# Patient Record
Sex: Female | Born: 1950 | Race: White | Hispanic: No | Smoking: Never smoker
Health system: Southern US, Community
[De-identification: ages and names within clinical notes are randomized; demographics above are authoritative.]

## PROBLEM LIST (undated history)

## (undated) DIAGNOSIS — N200 Calculus of kidney: Secondary | ICD-10-CM

## (undated) DIAGNOSIS — F419 Anxiety disorder, unspecified: Secondary | ICD-10-CM

## (undated) DIAGNOSIS — F4310 Post-traumatic stress disorder, unspecified: Secondary | ICD-10-CM

## (undated) HISTORY — PX: LAMINECTOMY: SHX219

## (undated) HISTORY — PX: AUGMENTATION MAMMAPLASTY: SUR837

## (undated) HISTORY — PX: TONSILLECTOMY: SUR1361

## (undated) HISTORY — PX: REDUCTION MAMMAPLASTY: SUR839

---

## 2014-05-28 DIAGNOSIS — Z87828 Personal history of other (healed) physical injury and trauma: Secondary | ICD-10-CM

## 2014-05-28 HISTORY — DX: Personal history of other (healed) physical injury and trauma: Z87.828

## 2017-11-12 ENCOUNTER — Other Ambulatory Visit: Payer: Self-pay | Admitting: Family Medicine

## 2017-11-12 DIAGNOSIS — Z1231 Encounter for screening mammogram for malignant neoplasm of breast: Secondary | ICD-10-CM

## 2017-11-12 DIAGNOSIS — E2839 Other primary ovarian failure: Secondary | ICD-10-CM

## 2018-01-29 ENCOUNTER — Ambulatory Visit
Admission: RE | Admit: 2018-01-29 | Discharge: 2018-01-29 | Disposition: A | Discharge status: Home / Self Care | Payer: BLUE CROSS/BLUE SHIELD | Source: Ambulatory Visit | Attending: Family Medicine | Admitting: Family Medicine

## 2018-01-29 ENCOUNTER — Encounter: Payer: Self-pay | Admitting: Radiology

## 2018-01-29 DIAGNOSIS — Z1231 Encounter for screening mammogram for malignant neoplasm of breast: Secondary | ICD-10-CM

## 2018-01-29 DIAGNOSIS — E2839 Other primary ovarian failure: Secondary | ICD-10-CM

## 2020-07-17 IMAGING — MG DIGITAL SCREENING BILATERAL MAMMOGRAM WITH IMPLANTS, CAD AND TOM
9 of 12 series · 9 of 28 positions shown · non-contrast
Comparison: Previous exam(s).

CLINICAL DATA: Screening.

EXAM:
DIGITAL SCREENING BILATERAL MAMMOGRAM WITH IMPLANTS, CAD AND TOMO
The patient has retropectoral implants. Standard and implant
displaced views were performed.

[R CC]
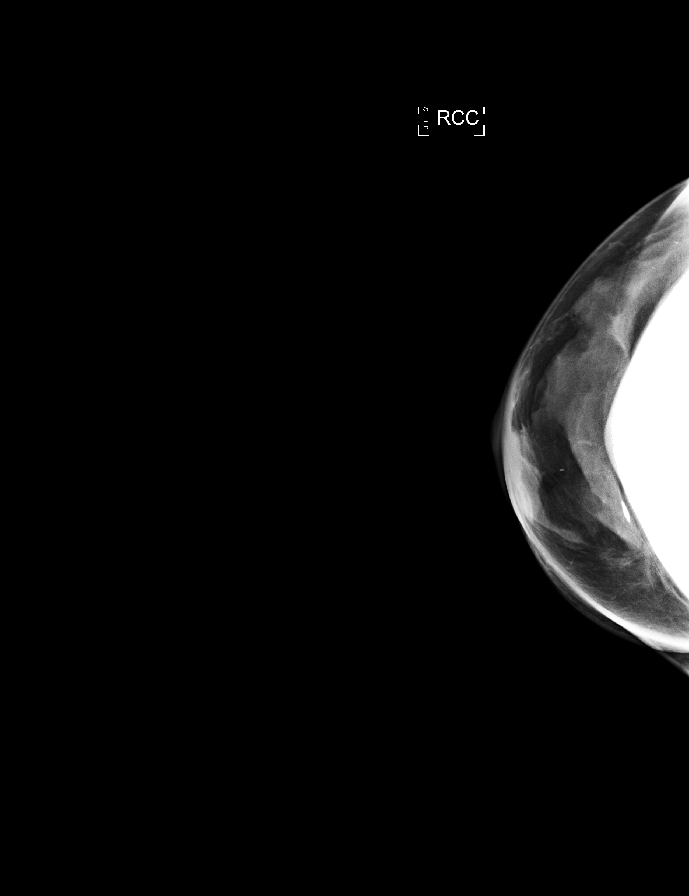

[L MLO]
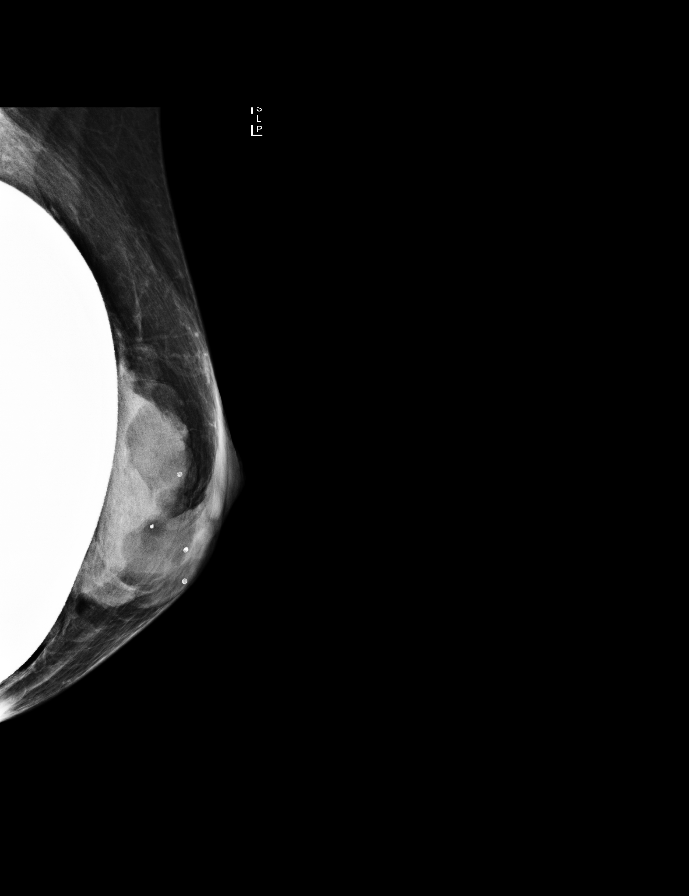

[R MLO]
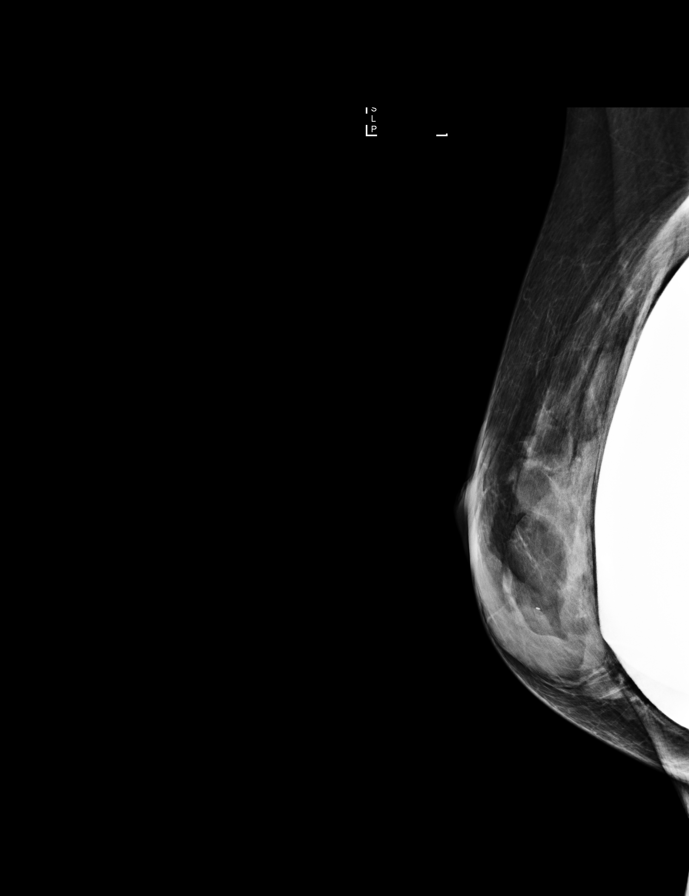

[L CC]
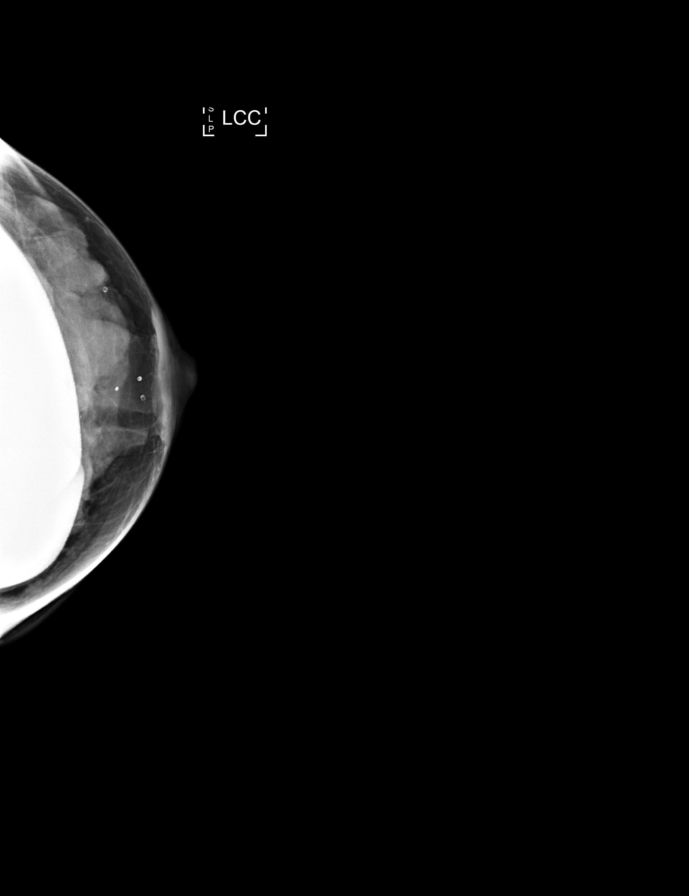

[L MLO synth-2D]
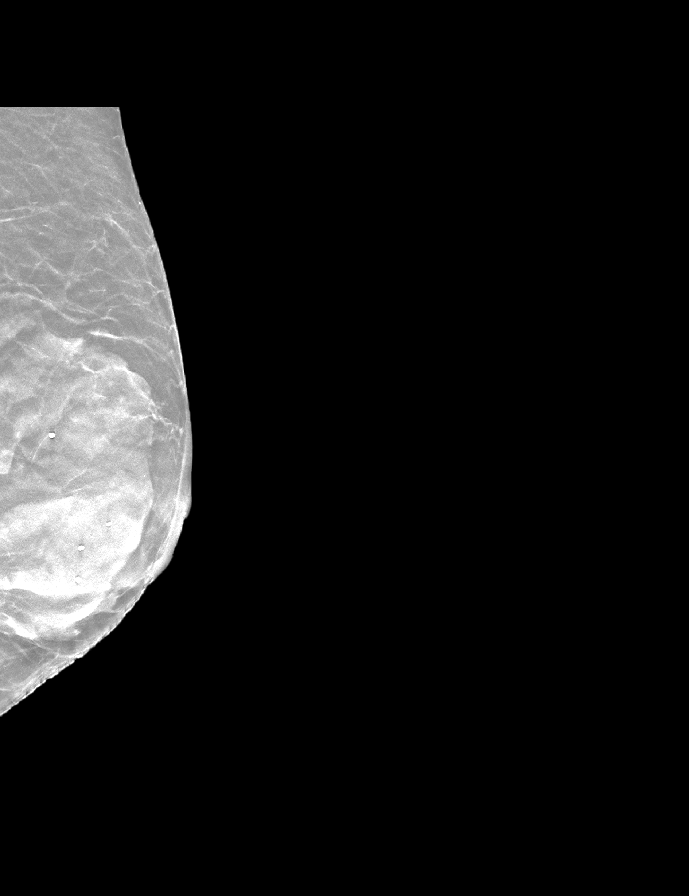

[R CC synth-2D]
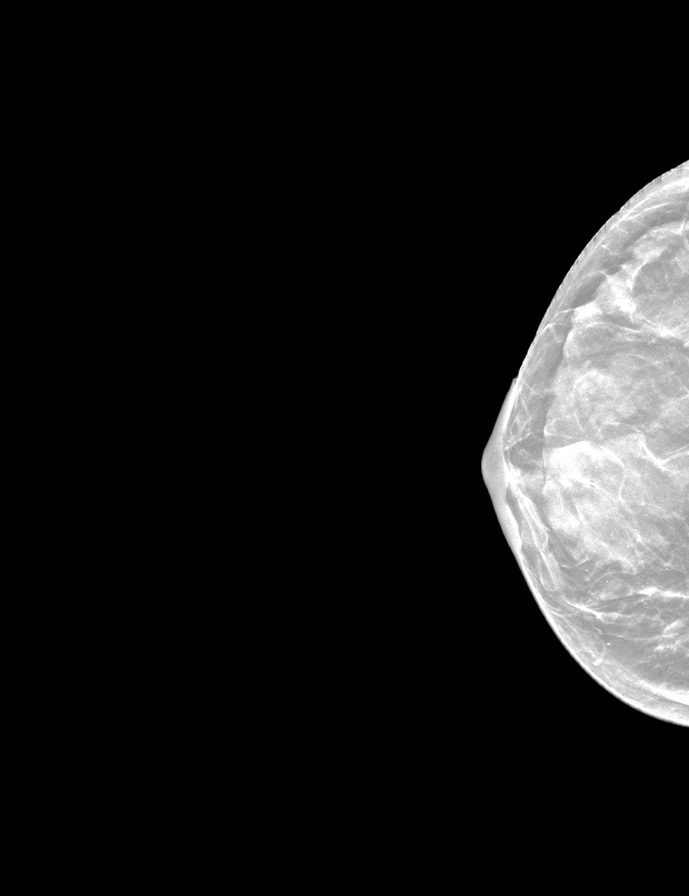

[L CC synth-2D]
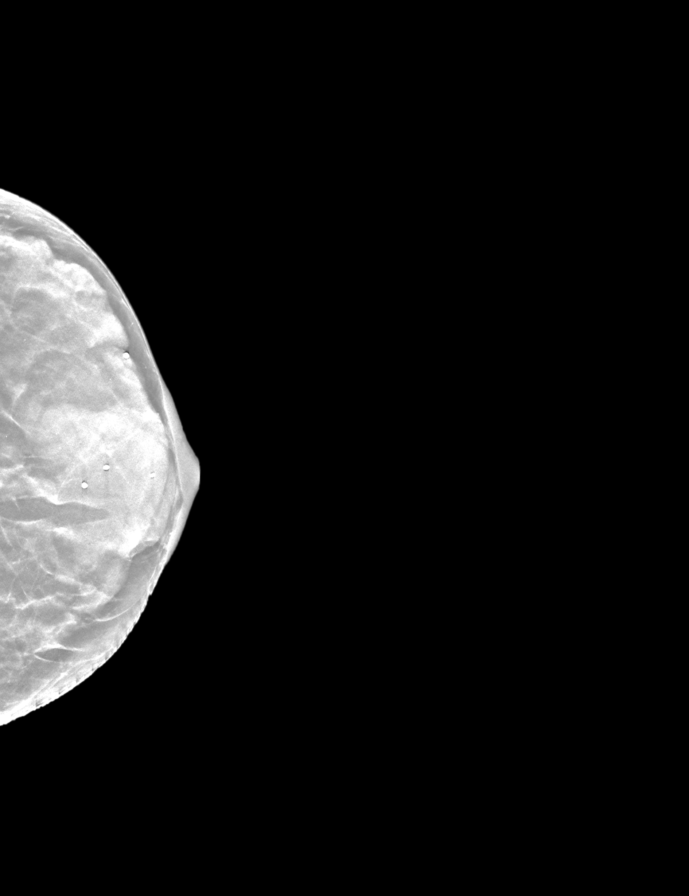

[R MLO synth-2D]
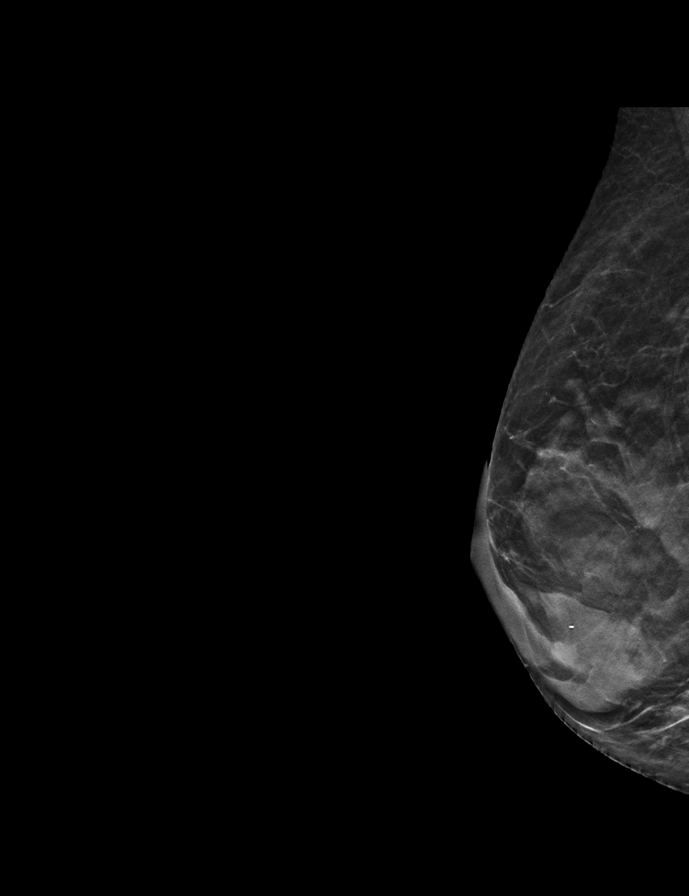

[R CCID BREAST TOMOSYNTHESIS IMAGE tomo · tomo slice 15/30.0]
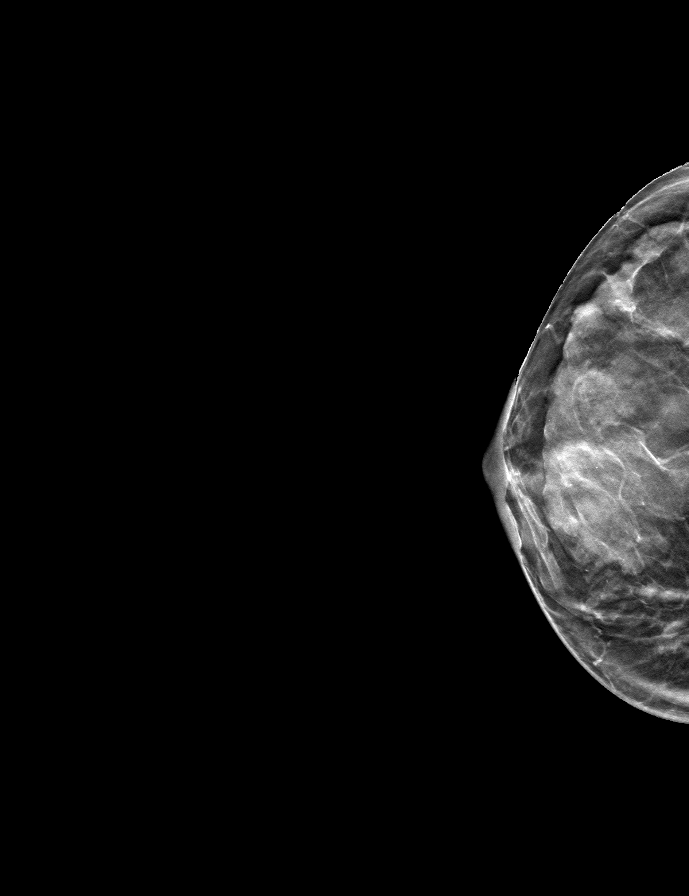

[9 of 28 positions shown; findings below may reference images not displayed]

ACR Breast Density Category c: The breast tissue is heterogeneously
dense, which may obscure small masses.
FINDINGS: There are no findings suspicious for malignancy. Images were
processed with CAD.
IMPRESSION: No mammographic evidence of malignancy. A result letter of this
screening mammogram will be mailed directly to the patient.

RECOMMENDATION:
Screening mammogram in one year. (Code:49-X-OQ9)

BI-RADS CATEGORY  1:  Negative.

## 2022-01-08 ENCOUNTER — Encounter (HOSPITAL_BASED_OUTPATIENT_CLINIC_OR_DEPARTMENT_OTHER): Payer: Self-pay

## 2022-01-08 ENCOUNTER — Other Ambulatory Visit: Payer: Self-pay

## 2022-01-08 ENCOUNTER — Emergency Department (HOSPITAL_BASED_OUTPATIENT_CLINIC_OR_DEPARTMENT_OTHER): Payer: Medicare Other

## 2022-01-08 ENCOUNTER — Emergency Department (HOSPITAL_BASED_OUTPATIENT_CLINIC_OR_DEPARTMENT_OTHER)
Admission: EM | Admit: 2022-01-08 | Discharge: 2022-01-09 | Disposition: A | Discharge status: Home / Self Care | Payer: Medicare Other | Attending: Emergency Medicine | Admitting: Emergency Medicine

## 2022-01-08 DIAGNOSIS — N10 Acute tubulo-interstitial nephritis: Secondary | ICD-10-CM | POA: Diagnosis not present

## 2022-01-08 DIAGNOSIS — N179 Acute kidney failure, unspecified: Secondary | ICD-10-CM

## 2022-01-08 DIAGNOSIS — R109 Unspecified abdominal pain: Secondary | ICD-10-CM | POA: Diagnosis present

## 2022-01-08 HISTORY — DX: Anxiety disorder, unspecified: F41.9

## 2022-01-08 HISTORY — DX: Calculus of kidney: N20.0

## 2022-01-08 HISTORY — DX: Post-traumatic stress disorder, unspecified: F43.10

## 2022-01-08 LAB — CBC WITH DIFFERENTIAL/PLATELET
Abs Immature Granulocytes: 0.02 10*3/uL (ref 0.00–0.07)
Basophils Absolute: 0 10*3/uL (ref 0.0–0.1)
Basophils Relative: 1 %
Eosinophils Absolute: 0.1 10*3/uL (ref 0.0–0.5)
Eosinophils Relative: 2 %
HCT: 43.4 % (ref 36.0–46.0)
Hemoglobin: 14.9 g/dL (ref 12.0–15.0)
Immature Granulocytes: 0 %
Lymphocytes Relative: 40 %
Lymphs Abs: 3.1 10*3/uL (ref 0.7–4.0)
MCH: 31.3 pg (ref 26.0–34.0)
MCHC: 34.3 g/dL (ref 30.0–36.0)
MCV: 91.2 fL (ref 80.0–100.0)
Monocytes Absolute: 0.5 10*3/uL (ref 0.1–1.0)
Monocytes Relative: 6 %
Neutro Abs: 4.1 10*3/uL (ref 1.7–7.7)
Neutrophils Relative %: 51 %
Platelets: 263 10*3/uL (ref 150–400)
RBC: 4.76 MIL/uL (ref 3.87–5.11)
RDW: 12.9 % (ref 11.5–15.5)
WBC: 7.9 10*3/uL (ref 4.0–10.5)
nRBC: 0 % (ref 0.0–0.2)

## 2022-01-08 LAB — BASIC METABOLIC PANEL
Anion gap: 10 (ref 5–15)
BUN: 33 mg/dL — ABNORMAL HIGH (ref 8–23)
CO2: 21 mmol/L — ABNORMAL LOW (ref 22–32)
Calcium: 9.4 mg/dL (ref 8.9–10.3)
Chloride: 110 mmol/L (ref 98–111)
Creatinine, Ser: 1.68 mg/dL — ABNORMAL HIGH (ref 0.44–1.00)
GFR, Estimated: 33 mL/min — ABNORMAL LOW (ref >60)
Glucose, Bld: 140 mg/dL — ABNORMAL HIGH (ref 70–99)
Potassium: 3.6 mmol/L (ref 3.5–5.1)
Sodium: 141 mmol/L (ref 135–145)

## 2022-01-08 LAB — URINALYSIS, ROUTINE W REFLEX MICROSCOPIC
Bilirubin Urine: NEGATIVE
Glucose, UA: NEGATIVE mg/dL
Hgb urine dipstick: NEGATIVE
Ketones, ur: NEGATIVE mg/dL
Nitrite: NEGATIVE
Specific Gravity, Urine: 1.03 (ref 1.005–1.030)
pH: 5 (ref 5.0–8.0)

## 2022-01-08 NOTE — ED Triage Notes (Addendum)
Left flank pain, hx of kidney stones Denies n/v Urinary frequency & dysuria Pain has escalated today Pt does report bilateral LE swelling

## 2022-01-09 ENCOUNTER — Emergency Department (HOSPITAL_BASED_OUTPATIENT_CLINIC_OR_DEPARTMENT_OTHER): Payer: Medicare Other | Admitting: Radiology

## 2022-01-09 DIAGNOSIS — N10 Acute tubulo-interstitial nephritis: Secondary | ICD-10-CM | POA: Diagnosis not present

## 2022-01-09 MED ORDER — CEPHALEXIN 500 MG PO CAPS: 500.0000 mg | ORAL_CAPSULE | Freq: Three times a day (TID) | ORAL | 0 refills | Status: AC

## 2022-01-09 MED ORDER — ACETAMINOPHEN 500 MG PO TABS
1000.0000 mg | ORAL_TABLET | Freq: Once | ORAL | Status: AC
  Administered 2022-01-09: 1000 mg via ORAL
  Filled 2022-01-09: qty 2

## 2022-01-09 MED ORDER — ONDANSETRON 4 MG PO TBDP: 4.0000 mg | ORAL_TABLET | Freq: Three times a day (TID) | ORAL | 0 refills | Status: AC | PRN

## 2022-01-09 MED ORDER — CEPHALEXIN 250 MG PO CAPS
500.0000 mg | ORAL_CAPSULE | Freq: Once | ORAL | Status: AC
  Administered 2022-01-09: 500 mg via ORAL
  Filled 2022-01-09: qty 2
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
## 2022-04-30 ENCOUNTER — Other Ambulatory Visit: Payer: Self-pay | Admitting: Neurology

## 2022-05-01 ENCOUNTER — Encounter: Payer: Self-pay | Admitting: Psychiatry

## 2022-05-01 ENCOUNTER — Ambulatory Visit (INDEPENDENT_AMBULATORY_CARE_PROVIDER_SITE_OTHER): Payer: Medicare Other | Admitting: Psychiatry

## 2022-05-01 VITALS — BP 125/85 | HR 84 | Ht 66.0 in | Wt 145.0 lb

## 2022-05-01 DIAGNOSIS — G43719 Chronic migraine without aura, intractable, without status migrainosus: Secondary | ICD-10-CM | POA: Diagnosis not present

## 2022-05-01 MED ORDER — QULIPTA 60 MG PO TABS: 60.0000 mg | ORAL_TABLET | Freq: Every day | ORAL | 6 refills | Status: DC

## 2022-05-01 MED ORDER — NARATRIPTAN HCL 2.5 MG PO TABS: 2.5000 mg | ORAL_TABLET | ORAL | 6 refills | Status: DC | PRN

## 2022-05-01 NOTE — Patient Instructions (Signed)
Continue Botox. Start Qulipta for headache prevention Start naratriptan as needed for migraines. Take one pill at onset of migraine. May repeat a dose in 4 hours if headache persists. Max dose 2 pills in 24 hours. Limit to 2 days per week to avoid rebound headaches.
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
## 2022-05-07 ENCOUNTER — Telehealth: Payer: Self-pay | Admitting: Neurology

## 2022-05-07 ENCOUNTER — Other Ambulatory Visit (HOSPITAL_COMMUNITY): Payer: Self-pay

## 2022-05-07 NOTE — Telephone Encounter (Signed)
PA completed on [employer] Electronic PA Form KEY:[id number] Will await determination

## 2022-05-07 NOTE — Telephone Encounter (Signed)
Pt is transferring care here for her botox treatment. Needs botox PA completed.  J Code- [id number] CPT [id number] ICD G43.719

## 2022-05-07 NOTE — Telephone Encounter (Signed)
Patient Advocate Encounter   Received notification that prior authorization for Botox 200 units is required.   PA Faxed to [facility] on 121/03/2022 Status is pending       Roland Earl, CPhT Pharmacy Patient Advocate Specialist [facility] Patient Advocate Team Direct Number: [phone]  Fax: [phone]

## 2022-05-08 MED ORDER — ONABOTULINUMTOXINA 200 UNITS IJ SOLR: 200.0000 [IU] | INTRAMUSCULAR | 1 refills | Status: DC

## 2022-05-08 NOTE — Telephone Encounter (Signed)
PA approved for the pt until 05/07/2023  CaseId:[id number];Status:Approved;Review Type:Prior Auth;Coverage Start Date:04/07/2022;Coverage End Date:05/07/2023;

## 2022-05-08 NOTE — Addendum Note (Signed)
Addended by: Judi Cong on: 05/08/2022 03:36 PM   Modules accepted: Orders

## 2022-05-08 NOTE — Telephone Encounter (Signed)
Patient Advocate Encounter  Prior Authorization for Botox 200 units  has been approved.    PA# [id number] Effective dates: 05/08/2022 through 05/08/2023  Must get through [facility]    Roland Earl, CPhT Pharmacy Patient Advocate Specialist [facility] Patient Advocate Team Direct Number: [phone]  Fax: [phone]

## 2022-05-09 ENCOUNTER — Encounter: Payer: Self-pay | Admitting: Neurology

## 2022-05-09 NOTE — Telephone Encounter (Signed)
Called the [facility] pharmacy to check on the prescription botox that was sent.  They did receive the prescription and will start processing. This can take 5-8 business days. The number I can provide the patients with is [phone] for pt to call and give permission to have the med delivered to [place]

## 2022-05-09 NOTE — Telephone Encounter (Signed)
Called the [facility] pharmacy to check on the prescription botox that was sent.  They did receive the prescription and will start processing. This can take 5-8 business days. The number I can provide the patients with is [phone] for pt to call and give permission to have the med delivered to us   

## 2022-05-14 ENCOUNTER — Other Ambulatory Visit: Payer: Self-pay | Admitting: Neurology

## 2022-05-14 ENCOUNTER — Other Ambulatory Visit: Payer: Self-pay

## 2022-05-14 MED ORDER — ONABOTULINUMTOXINA 200 UNITS IJ SOLR: 200.0000 [IU] | INTRAMUSCULAR | 1 refills | Status: DC

## 2022-05-16 ENCOUNTER — Other Ambulatory Visit: Payer: Self-pay

## 2022-05-16 MED ORDER — ONABOTULINUMTOXINA 200 UNITS IJ SOLR: 200.0000 [IU] | INTRAMUSCULAR | 1 refills | Status: DC

## 2022-05-29 NOTE — Telephone Encounter (Signed)
Delivery pending consent from pt. 

## 2022-06-06 NOTE — Telephone Encounter (Signed)
Santiago Glad is calling from [place]. Stated pt needs a PA for [employer].

## 2022-06-07 ENCOUNTER — Other Ambulatory Visit (HOSPITAL_COMMUNITY): Payer: Self-pay

## 2022-06-08 ENCOUNTER — Other Ambulatory Visit (HOSPITAL_COMMUNITY): Payer: Self-pay

## 2022-06-11 ENCOUNTER — Other Ambulatory Visit (HOSPITAL_COMMUNITY): Payer: Self-pay

## 2022-06-12 ENCOUNTER — Other Ambulatory Visit (HOSPITAL_COMMUNITY): Payer: Self-pay

## 2022-06-12 MED ORDER — ONABOTULINUMTOXINA 200 UNITS IJ SOLR
200.0000 [IU] | INTRAMUSCULAR | 1 refills | Status: DC
  Filled 2022-06-12: qty 1, 90d supply, fill #0

## 2022-06-12 NOTE — Telephone Encounter (Signed)
LVM asking pt to call back and schedule botox appt.

## 2022-06-12 NOTE — Addendum Note (Signed)
Addended by: Darleen Crocker on: 06/12/2022 08:45 AM   Modules accepted: Orders

## 2022-06-15 ENCOUNTER — Other Ambulatory Visit (HOSPITAL_COMMUNITY): Payer: Self-pay

## 2022-06-15 ENCOUNTER — Ambulatory Visit (HOSPITAL_COMMUNITY)
Admission: EM | Admit: 2022-06-15 | Discharge: 2022-06-15 | Disposition: A | Discharge status: Home / Self Care | Payer: Medicare Other | Attending: Psychiatry | Admitting: Psychiatry

## 2022-06-15 DIAGNOSIS — Z79899 Other long term (current) drug therapy: Secondary | ICD-10-CM | POA: Diagnosis not present

## 2022-06-15 DIAGNOSIS — G4700 Insomnia, unspecified: Secondary | ICD-10-CM | POA: Insufficient documentation

## 2022-06-15 DIAGNOSIS — F338 Other recurrent depressive disorders: Secondary | ICD-10-CM | POA: Diagnosis not present

## 2022-06-15 DIAGNOSIS — F39 Unspecified mood [affective] disorder: Secondary | ICD-10-CM | POA: Insufficient documentation

## 2022-06-15 DIAGNOSIS — F411 Generalized anxiety disorder: Secondary | ICD-10-CM | POA: Diagnosis present

## 2022-06-15 NOTE — Progress Notes (Signed)
   06/15/22 1539  [place] (Walk-ins at [facility] only)  How Did You Hear About [place]? Family/Friend (sister, [name] Pulse)  What Is the Reason for Your Visit/Call Today? Pt is a 72 yo female who presented voluntarily and accompanied by her sister, [name] Pulse due to worsening depressiojn and anxiety. Pt denied SI, HI, NSSH, AVH, paranoia and any regular substance use. Pt reported she "quit drinking just before the recent hollidays." Pt stated she consumed alcohol 1-2 times a week up until about 2 months ago. Pt stated she has been diagnosed with MDD, GAD and PTSD and prescribed psychiatric medications by her PCP, Dr. [name]. Pt stated she is compliant with her prescribed medications. Pt reported her appetite has been decreased recently. Pt's sister whispered to this clinician as she left the room following pt that "she said she wants to die."  How Long Has This Been Causing You Problems? 1 wk - 1 month  Have You Recently Had Any Thoughts About Hurting Yourself? No  Are You Planning to Commit Suicide/Harm Yourself At This time? No  Have you Recently Had Thoughts About [place]? No  Are You Planning To Harm Someone At This Time? No  Are you currently experiencing any auditory, visual or other hallucinations? No  Have You Used Any Alcohol or Drugs in the Past 24 Hours? No  Do you have any current medical co-morbidities that require immediate attention? No  Clinician description of patient physical appearance/behavior: Pt was calm, cooperative, alert and appeared oriented. Pt did not appear to be responding to internal stimuli, experiencing delusional thinking or to be intoxicated.  Pt's speech and movement appeared within normal limits and appearance was unremarkable. Pt's mood seemed solemn and somewhat depressed, and pt had a flat affect which was congruent. Pt's judgment and insight seemed within normal limits.  What Do You Feel Would Help You the Most Today? Treatment for Depression or other mood  problem  If access to [facility] was not available, would you have sought care in the Emergency Department? No  Determination of Need Routine (7 days)  Options For Referral Medication Management;Outpatient Therapy   Kashonda Sarkisyan T. Mare Ferrari, [place], [facility], [facility] Triage Specialist [facility]
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
## 2022-06-15 NOTE — Discharge Instructions (Addendum)
Dear [name],  Sunday [place] box therapy for your mood. 10-15 mins every morning Take trazodone nightly for now to help you sleep.  You have the option to call 211 for assistance in finding additional mental health agencies if these do not meet your needs.     LOCATIONS WITH PHP IOP PHP = Partial Hospitalization Program IOP = Intensive Outpatient Program  [facility] Address:  [address] [place], [place] [address] [phone] phone - MUST CALL TO COMPLETE A PHONE ASSESSMENT [phone] fax PHP - 9am-3pm, 5 days/week (in-person); IOP - 9am-12pm, 3 days/week (virtual; you choose the days) ([facility], [place], [place], [place], [place], [place])  [facility] Address: [id number] [place] [address] Phone [phone]    Some of the services offered:  Partial hospitalization program (PHP) Intensive outpatient program (IOP) Substance abuse intensive outpatient [facility]) PTSD  Trauma      Other locations to try:        [place], [facility]      [place], [place] [address]      ([address]) [place]      [place], [place], [address]      [phone] phone      [phone] fax        [place] at [facility]      [address]      [place], [place], [address]      [phone] phone to set up initial appointment       [place]     [address]     [place], [place], [address]     [phone] phone     [phone] fax  ________________________________________________________  [place]  IBBC 488 for [place]    Text [phone] for Crisis Text Line:     [facility]:  [address] FIRST FLOOR.  [place], [place] [address].  [phone]  Mobile Crisis Response Teams Listed by counties in vicinity of [place]. [phone] [place] [phone] [place] [phone] [facility] [phone] [place] [phone] [place]. [phone] [place].  [place] [phone] ________________________________________________________  To see which pharmacy near you is the CHEAPEST for certain medications, please use GoodRx. It is free website and has a free phone app.    Also consider looking at [facility] $4.00 or [facility]'s $7.00 prescription list. Both are free to view if googled "[facility] $4 prescription" and "public's $7 prescription". These are set prices, no insurance required. [facility]'s low cost medications: $4-$15 for 30days prescriptions or $10-$38 for 90days prescriptions  ________________________________________________________  Difficulties with sleep?   Can also use this free app for insomnia called CBT-I. Let your doctors and therapists know so they can help with extra tips and tricks or for guidance and accountability. NO ADDS on the app.     ________________________________________________________  Non-Emergent / Urgent  [place]  [place], [place] [address] [phone] OUTPATIENT Walk-in information: Please note, all walk-ins are first come & first serve, with limited number of availability.  Please note that to be eligible for services you must bring: ID or a piece of mail with your name [facility] address  Therapist for therapy:  Monday & Wednesdays: Please ARRIVE at 7:15 AM for registration Will START at 8:00 AM Every 1st & 2nd Friday of the  month: Please ARRIVE at 10:15 AM for registration Will START at 1 PM - 5 PM  Psychiatrist for medication management: Monday - Friday:  Please ARRIVE at 7:15 AM for registration Will START at 8:00 AM  Regretfully, due to limited availability, please be aware that you may not been seen on the same day as walk-in. Please consider making an appoint or try again. Thank you for your patience and understanding.

## 2022-06-19 ENCOUNTER — Telehealth: Payer: Self-pay | Admitting: Neurology

## 2022-06-19 ENCOUNTER — Telehealth (HOSPITAL_COMMUNITY): Payer: Self-pay | Admitting: Psychiatry

## 2022-06-19 ENCOUNTER — Other Ambulatory Visit (HOSPITAL_COMMUNITY): Payer: Self-pay

## 2022-06-19 NOTE — Telephone Encounter (Signed)
Scheduled pt for 07/19/2022 at 2:30 pm.

## 2022-06-19 NOTE — Telephone Encounter (Signed)
Pt has uploaded insurance card and needs new PA for Botox J Code [id number] CPT [id number] ICD V49.449

## 2022-06-19 NOTE — Telephone Encounter (Signed)
D:  [name], DO referred pt to [place] on 06-18-22.  A:  Placed call and oriented pt.  Will schedule CCA for tomorrow at 9:15 a.m.; pt will start on 06-21-22.  Encouraged pt to verify her insurance benefits for MH-IOP.  Inform [place] Free.  R:  Pt receptive.

## 2022-06-20 ENCOUNTER — Other Ambulatory Visit (HOSPITAL_COMMUNITY): Payer: Medicare Other | Attending: Psychiatry | Admitting: Psychiatry
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
## 2022-06-21 ENCOUNTER — Other Ambulatory Visit (HOSPITAL_COMMUNITY): Payer: Medicare Other | Attending: Licensed Clinical Social Worker | Admitting: Psychiatry

## 2022-06-21 ENCOUNTER — Encounter (HOSPITAL_COMMUNITY): Payer: Self-pay | Admitting: Psychiatry

## 2022-06-21 DIAGNOSIS — F332 Major depressive disorder, recurrent severe without psychotic features: Secondary | ICD-10-CM

## 2022-06-21 DIAGNOSIS — F411 Generalized anxiety disorder: Secondary | ICD-10-CM | POA: Insufficient documentation

## 2022-06-21 DIAGNOSIS — F4310 Post-traumatic stress disorder, unspecified: Secondary | ICD-10-CM | POA: Insufficient documentation

## 2022-06-21 DIAGNOSIS — F338 Other recurrent depressive disorders: Secondary | ICD-10-CM

## 2022-06-21 DIAGNOSIS — F0781 Postconcussional syndrome: Secondary | ICD-10-CM | POA: Diagnosis not present

## 2022-06-21 MED ORDER — ARIPIPRAZOLE 2 MG PO TABS: 2.0000 mg | ORAL_TABLET | Freq: Every day | ORAL | 0 refills | Status: DC

## 2022-06-21 NOTE — Progress Notes (Addendum)
Virtual Visit via Video Note   I connected with Hayley Bowman on 06/21/22 at  9:00 AM EDT by a video enabled telemedicine application and verified that I am speaking with the correct person using two identifiers.   At orientation to the IOP program, Case Manager discussed the limitations of evaluation and management by telemedicine and the availability of in person appointments. The patient expressed understanding and agreed to proceed with virtual visits throughout the duration of the program.   Location:  Patient: Patient Home Provider: [facility] [place] [facility]   History of Present Illness: MDD and GAD   Observations/Objective: Check In: Case Manager checked in with all participants to review discharge dates, insurance authorizations, work-related documents and needs from the treatment team regarding medications. Hayley Bowman stated needs and engaged in discussion.    Initial Therapeutic Activity: Counselor facilitated a check-in with Hayley Bowman to assess for safety, sobriety and medication compliance.  Counselor also inquired about Hayley Bowman's current emotional ratings, as well as any significant changes in thoughts, feelings or behavior since previous check in.  Hayley Bowman presented for session on time and was alert, oriented x5, with no evidence or self-report of active SI/HI or A/V H.  Hayley Bowman reported compliance with medication and denied use of alcohol or illicit substances.  Hayley Bowman reported scores of 5/10 for depression, 5/10 for anxiety, and 0/10 for anger/irritability.  Hayley Bowman denied any recent outbursts or panic attacks.  Hayley Bowman reported that a recent success was making the decision to enter group therapy following a period of isolation and depression, stating "I have no desire to do anything, and something has to change".  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    Second Therapeutic Activity: Counselor discussed topic of sleep hygiene today with group.  Counselor defined this as the habits, behaviors and environmental factors that can be adjusted to improve overall sleep quality.  Counselor also discussed how lack of consistent sleep can negatively affect mood and overall mental health.  Counselor inquired about members' sleep hygiene at present, including average nightly rest and awareness of any barriers which could be affecting this.  Counselor offered techniques to members via a virtual handout which could be implemented in order to improve sleep hygiene, such as sticking to a normal morning/night routine, avoiding using of electronics too close to bedtime, avoiding heavy meals before bed, using a sleep journal to track changes and address anxious thoughts, as well as avoiding naps during the daytime, ensuring proper use of medications if prescribed any by provider(s), and more.  Counselor inquired about changes members intend to make to sleep hygiene based upon information covered today.  Interventions were effective, as evidenced by Hayley Bowman actively participating in discussion on subject, reporting that she currently averages 4 hours of sleep at night thanks to her prescription of Trazodone along with Melatonin as recommended by her doctor.  Hayley Bowman was receptive to several sleep hygiene techniques that were discussed today as potential solutions, including establishing a regular sleep/wake routine each day, listening to an audiobook in bed when she is not tired enough to fall asleep and needs to pass time, avoiding extended naps during daytime, and maintaining regular sleep rituals to cue her body at bedtime (i.e. brushing teeth, getting into pajamas or doing  yoga).  Hayley Bowman reported that although she is familiar with these approaches, she is more motivated now to begin taking action to improve overall sleep hygiene.       Assessment and Plan: Counselor recommends that Hayley Bowman remain in IOP treatment to better manage mental health symptoms, ensure stability and pursue completion of treatment plan goals. Counselor recommends adherence to crisis/safety plan, taking medications as prescribed, and following up with medical professionals if any issues arise.   Follow Up Instructions: Counselor will send Webex link for next session. Hayley Bowman was advised to call back or seek an in-person evaluation if the symptoms worsen or if the condition fails to improve as anticipated.   Collaboration of Care:   Medication Management AEB Dr. Fatima Sanger or Ricky Ala, NP                                          Case Manager AEB [name], CNA   Patient/Guardian was advised Release of Information must be obtained prior to any record release in order to collaborate their care with an outside provider. Patient/Guardian was advised if they have not already done so to contact the registration department to sign all necessary forms in order for [place] to release information regarding their care.   Consent: Patient/Guardian gives verbal consent for treatment and assignment of benefits for services provided during this visit. Patient/Guardian expressed understanding and agreed to proceed.  I provided 180 minutes of non-face-to-face time during this encounter.   [name], [place], LCAS 06/21/22
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
## 2022-06-22 ENCOUNTER — Encounter (HOSPITAL_COMMUNITY): Payer: Self-pay | Admitting: Psychiatry

## 2022-06-22 ENCOUNTER — Other Ambulatory Visit (HOSPITAL_COMMUNITY): Payer: Medicare Other | Admitting: Licensed Clinical Social Worker

## 2022-06-22 DIAGNOSIS — F411 Generalized anxiety disorder: Secondary | ICD-10-CM

## 2022-06-22 DIAGNOSIS — F332 Major depressive disorder, recurrent severe without psychotic features: Secondary | ICD-10-CM

## 2022-06-22 NOTE — Progress Notes (Signed)
Virtual Visit via Video Note   I connected with Hayley Bowman on 06/22/22 at  9:00 AM EDT by a video enabled telemedicine application and verified that I am speaking with the correct person using two identifiers.   At orientation to the IOP program, Case Manager discussed the limitations of evaluation and management by telemedicine and the availability of in person appointments. The patient expressed understanding and agreed to proceed with virtual visits throughout the duration of the program.   Location:  Patient: Patient Home Provider: [facility] [place] [facility]   History of Present Illness: MDD and GAD   Observations/Objective: Check In: Case Manager checked in with all participants to review discharge dates, insurance authorizations, work-related documents and needs from the treatment team regarding medications. Hayley Bowman stated needs and engaged in discussion.    Initial Therapeutic Activity: Counselor facilitated a check-in with Hayley Bowman to assess for safety, sobriety and medication compliance.  Counselor also inquired about Hayley Bowman's current emotional ratings, as well as any significant changes in thoughts, feelings or behavior since previous check in.  Hayley Bowman presented for session on time and was alert, oriented x5, with no evidence or self-report of active SI/HI or A/V H.  Hayley Bowman reported compliance with medication and denied use of alcohol or illicit substances.  Hayley Bowman reported scores of 4/10 for depression, 4/10 for anxiety, and 0/10 for anger/irritability.  Henchy denied any recent outbursts or panic attacks.  Hayley Bowman reported that a recent success was enjoying her first group yesterday and getting out of the house to assist a client.  Hayley Bowman reported that a recent struggle has been managing boundaries with clients in a difficult career.  Hayley Bowman reported that her goal this weekend is to spend quality time with her family and watch a football game to pass the time.       Second Therapeutic Activity: Counselor covered topic  of attachment styles today.  Counselor virtually shared a handout with the group on this topic which defined attachment styles as how people think about and behave in relationships.  Styles were broken down by category, including secure attachment where one believes close relationships are trustworthy, compared to insecure attachment (i.e. anxious, avoidant, or anxious-avoidant) where one is distrusting or worries about their bond with others.  Counselor inquired about which attachment style members most related to, how this has influenced their mental health/well-being, and whether they intend to begin making any changes.  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  Hayley Bowman reported that she used to feel securely attached to most supports in her network, but over time their expectations became too overwhelming, and led her to isolate as a protective measure for her wellbeing.  Hayley Bowman stated "I avoid everything and stay to myself.  I don't ask for help, and it used to be that I would help you with anything, but now I begrudge it".  Hayley Bowman reported that her goal is to learn how to be more assertive during [place] in order to set healthier boundaries with supports and ensure that she can more honestly express her thoughts, feelings, and needs with them while restoring secure attachment.     Third Therapeutic Activity: Psycho-educational portion of group was provided by Christie Beckers, Mud[employer] of community education with [employer].  Alexandra provided  information on history of her local agency, mission statement, and the variety of unique services offered which group members might find beneficial to engage in, including both virtual and  in-person support groups, as well as peer support program for mentoring.  Alexandra offered time to answer member's questions regarding services and encouraged them to consider utilizing these services to assist in working towards their individual wellness goals.  Intervention was effective, as evidenced by Hayley Bowman participating in discussion with speaker on the subject, and expressing receptiveness to some of the services offered, stating "I may look into their website later today.  It could be helpful".      Assessment and Plan: Counselor recommends that Hayley Bowman remain in IOP treatment to better manage mental health symptoms, ensure stability and pursue completion of treatment plan goals. Counselor recommends adherence to crisis/safety plan, taking medications as prescribed, and following up with medical professionals if any issues arise.   Follow Up Instructions: Counselor will send Webex link for next session. Hayley Bowman was advised to call back or seek an in-person evaluation if the symptoms worsen or if the condition fails to improve as anticipated.   Collaboration of Care:   Medication Management AEB Dr. Fatima Sanger or Ricky Ala, NP                                          Case Manager AEB [name], CNA   Patient/Guardian was advised Release of Information must be obtained prior to any record release in order to collaborate their care with an outside provider. Patient/Guardian was advised if they have not already done so to contact the registration department to sign all necessary forms in order for [place] to release information regarding their care.   Consent: Patient/Guardian gives verbal consent for treatment and assignment of benefits for services provided during this visit. Patient/Guardian expressed understanding and agreed to proceed.  I provided 180 minutes of non-face-to-face time during this encounter.   [name], [place], LCAS 06/22/22

## 2022-06-25 ENCOUNTER — Other Ambulatory Visit (HOSPITAL_COMMUNITY): Payer: Medicare Other | Admitting: Licensed Clinical Social Worker

## 2022-06-25 DIAGNOSIS — F332 Major depressive disorder, recurrent severe without psychotic features: Secondary | ICD-10-CM

## 2022-06-25 DIAGNOSIS — F411 Generalized anxiety disorder: Secondary | ICD-10-CM

## 2022-06-25 NOTE — Progress Notes (Signed)
Virtual Visit via Video Note   I connected with Hayley Bowman on 06/25/22 at  9:00 AM EDT by a video enabled telemedicine application and verified that I am speaking with the correct person using two identifiers.   At orientation to the IOP program, Case Manager discussed the limitations of evaluation and management by telemedicine and the availability of in person appointments. The patient expressed understanding and agreed to proceed with virtual visits throughout the duration of the program.   Location:  Patient: Patient Home Provider: [facility]   History of Present Illness: MDD and GAD   Observations/Objective: Check In: Case Manager checked in with all participants to review discharge dates, insurance authorizations, work-related documents and needs from the treatment team regarding medications. Hayley Bowman stated needs and engaged in discussion.    Initial Therapeutic Activity: Counselor facilitated a check-in with Hayley Bowman to assess for safety, sobriety and medication compliance.  Counselor also inquired about Hayley Bowman's current emotional ratings, as well as any significant changes in thoughts, feelings or behavior since previous check in.  Hayley Bowman presented for session on time and was alert, oriented x5, with no evidence or self-report of active SI/HI or A/V H.  Hayley Bowman reported compliance with medication and denied use of alcohol or illicit substances.  Hayley Bowman reported scores of 3/10 for depression, 0/10 for anxiety, and 0/10 for anger/irritability.  Hayley Bowman denied any recent outbursts or panic attacks.  Hayley Bowman reported that a recent success was taking time over the weekend to watch a football game.  Hayley Bowman reported that a recent struggle was attending a [employer] for someone that passed out of state, stating "I miss her so much".  Hayley Bowman reported that her goal today is to fold some clothes, visit the pharmacy, and run other errands.         Second Therapeutic Activity: Counselor introduced  topic of self-care today.  Counselor explained how this can be defined as the things one does to maintain good health and improve well-being.  Counselor provided members with a self-care assessment form to complete.  This handout featured various sub-categories of self-care, including physical, psychological/emotional, social, spiritual, and professional.  Members were asked to rank their engagement in the activities listed for each dimension on a scale of 1-3, with 1 indicating 'Poor', 2 indicating '[place]', and 3 indicating 'Well'.  Counselor invited members to share results of their assessment, and inquired about which areas of self-care they are doing well in, as well as areas that require attention, and how they plan to begin addressing this during treatment.  Intervention was effective, as evidenced by Hayley Bowman successfully completing initial 2 sections of assessment and actively engaging in discussion on subject, reporting that she is excelling in areas such as eating healthy foods, taking care of personal hygiene, attending preventative medical appointments, participating in hobbies, learning new things unrelated to work or school, and finding reasons to laugh, but would benefit from focusing more on areas such as exercising more often, eating regularly, participating in fun activities, expressing feelings in healthy ways, going on vacations or daytrips, and recognizing her strengths and achievements.  Hayley Bowman reported that she would work to improve self-care deficits by taking walks with her dog and visiting the gym to get healthier, prioritizing regular meals each day to keep energy up, staying engaged in therapy to ensure healthy outlet for feelings and reinforcement of strengths, and scheduling a trip to see friends in [place], [place] soon.    Assessment and Plan: Counselor recommends that  Hayley Bowman remain in IOP treatment to better manage mental health symptoms, ensure stability and pursue completion of treatment plan  goals. Counselor recommends adherence to crisis/safety plan, taking medications as prescribed, and following up with medical professionals if any issues arise.   Follow Up Instructions: Counselor will send Webex link for next session. Hayley Bowman was advised to call back or seek an in-person evaluation if the symptoms worsen or if the condition fails to improve as anticipated.   Collaboration of Care:   Medication Management AEB Dr. Fatima Sanger or Ricky Ala, NP                                          Case Manager AEB [name], CNA   Patient/Guardian was advised Release of Information must be obtained prior to any record release in order to collaborate their care with an outside provider. Patient/Guardian was advised if they have not already done so to contact the registration department to sign all necessary forms in order for [place] to release information regarding their care.   Consent: Patient/Guardian gives verbal consent for treatment and assignment of benefits for services provided during this visit. Patient/Guardian expressed understanding and agreed to proceed.  I provided 180 minutes of non-face-to-face time during this encounter.   [name], [place], LCAS 06/25/22

## 2022-06-26 ENCOUNTER — Other Ambulatory Visit (HOSPITAL_COMMUNITY): Payer: Medicare Other | Admitting: Licensed Clinical Social Worker

## 2022-06-26 DIAGNOSIS — F411 Generalized anxiety disorder: Secondary | ICD-10-CM

## 2022-06-26 DIAGNOSIS — F332 Major depressive disorder, recurrent severe without psychotic features: Secondary | ICD-10-CM

## 2022-06-26 NOTE — Progress Notes (Signed)
Virtual Visit via Video Note   I connected with Hayley Bowman on 06/26/22 at  9:00 AM EDT by a video enabled telemedicine application and verified that I am speaking with the correct person using two identifiers.   At orientation to the IOP program, Case Manager discussed the limitations of evaluation and management by telemedicine and the availability of in person appointments. The patient expressed understanding and agreed to proceed with virtual visits throughout the duration of the program.   Location:  Patient: Patient Home Provider: [facility]   History of Present Illness: MDD and GAD   Observations/Objective: Check In: Case Manager checked in with all participants to review discharge dates, insurance authorizations, work-related documents and needs from the treatment team regarding medications. Hayley Bowman stated needs and engaged in discussion.    Initial Therapeutic Activity: Counselor facilitated a check-in with Hayley Bowman to assess for safety, sobriety and medication compliance.  Counselor also inquired about Hayley Bowman's current emotional ratings, as well as any significant changes in thoughts, feelings or behavior since previous check in.  Hayley Bowman presented for session on time and was alert, oriented x5, with no evidence or self-report of active SI/HI or A/V H.  Hayley Bowman reported compliance with medication and denied use of alcohol or illicit substances.  Hayley Bowman reported scores of 1/10 for depression, 0/10 for anxiety, and 0/10 for anger/irritability.  Hayley Bowman denied any recent outbursts or panic attacks.  Hayley Bowman reported that a recent success was having a productive day yesterday, which allowed her to get several important things done, and left her with a sense of accomplishment.  Hayley Bowman denied any new challenges at this time.  Hayley Bowman reported that her goal today is to make some phone calls to stay busy.       Second Therapeutic Activity: Counselor introduced [employer] Bowman, [place] Chaplain to provide  psychoeducation on topic of Grief and Loss with members today.  Hayley Bowman began discussion by checking in with the group about their baseline mood today, general thoughts on what grief means to them and how it has affected them personally in the past.  Hayley Bowman provided information on how the process of grief/loss can differ depending upon one's unique culture, and categories of loss one could experience (i.e. loss of a person, animal, relationship, job, identity, etc).  Hayley Bowman encouraged members to be mindful of how pervasive loss can be, and how to recognize signs which could indicate that this is having an impact on one's overall mental health and wellbeing.  Intervention was effective, as evidenced by Hayley Bowman participating in discussion with speaker on the subject, reporting that grief is something that is difficult for her, as she experienced a loss while she was in school, and compartmentalized her feelings for fear that she might have to drop out otherwise.  Hayley Bowman stated "Its heartbreaking and I try to avoid it.  Its almost like its uncontrollable and you feel the hurt all over.  I don't want anybody to feel what I'm feeling".  Hayley Bowman was receptive to feedback from chaplain on importance of being mindful of triggers for grief, and seeking appropriate treatment to process unresolved grief as she continues with therapy.    Third Therapeutic Activity: Counselor introduced topic of grounding skills today.  Counselor defined these as simple strategies one can use to help detach from difficult thoughts or feelings temporarily by focusing on something else.  Counselor noted that grounding will not solve the problem at hand, but can provide the practitioner with time to regain control  over their thoughts and/or feelings and prevent the situation from getting worse (i.e. interrupting a panic attack).  Counselor divided these into three categories (mental, physical, and soothing) and then provided examples of each which group  members could practice during session.  Some of these included describing one's environment in detail or playing a categories game with oneself for mental category, taking a hot bath/shower, stretching, or carrying a grounding object for physical category, and saying kind statements, or visualizing people one cares about for soothing category.  Counselor inquired about which techniques members have used with success in the past, or will commit to learning, practicing, and applying now to improve coping abilities.  Intervention was effective, as evidenced by Hayley Bowman participating in discussion on the subject, trying out several of the techniques during session, and expressing interest in adding several to her available coping skills, such as using her imagination to visualize a more positive outcome for the stressful situation, recalling a funny memory to make her laugh, sharing memes with her sister that can make them laugh, or practicing deep breathing.   Assessment and Plan: Counselor recommends that Hayley Bowman remain in IOP treatment to better manage mental health symptoms, ensure stability and pursue completion of treatment plan goals. Counselor recommends adherence to crisis/safety plan, taking medications as prescribed, and following up with medical professionals if any issues arise.   Follow Up Instructions: Counselor will send Webex link for next session. Hayley Bowman was advised to call back or seek an in-person evaluation if the symptoms worsen or if the condition fails to improve as anticipated.   Collaboration of Care:   Medication Management AEB Dr. Fatima Sanger or Ricky Ala, NP                                          Case Manager AEB [name], CNA   Patient/Guardian was advised Release of Information must be obtained prior to any record release in order to collaborate their care with an outside provider. Patient/Guardian was advised if they have not already done so to contact the registration  department to sign all necessary forms in order for [place] to release information regarding their care.   Consent: Patient/Guardian gives verbal consent for treatment and assignment of benefits for services provided during this visit. Patient/Guardian expressed understanding and agreed to proceed.  I provided 180 minutes of non-face-to-face time during this encounter.   [name], [place], LCAS 06/26/22

## 2022-06-27 ENCOUNTER — Other Ambulatory Visit (HOSPITAL_COMMUNITY): Payer: Medicare Other | Admitting: Psychiatry

## 2022-06-27 DIAGNOSIS — F332 Major depressive disorder, recurrent severe without psychotic features: Secondary | ICD-10-CM

## 2022-06-27 DIAGNOSIS — F411 Generalized anxiety disorder: Secondary | ICD-10-CM

## 2022-06-27 NOTE — Progress Notes (Signed)
Virtual Visit via Video Note   I connected with Yasira L. Lichter on 06/27/22 at  9:00 AM EDT by a video enabled telemedicine application and verified that I am speaking with the correct person using two identifiers.   At orientation to the IOP program, Case Manager discussed the limitations of evaluation and management by telemedicine and the availability of in person appointments. The patient expressed understanding and agreed to proceed with virtual visits throughout the duration of the program.   Location:  Patient: Patient Home Provider: [facility] [place] [facility]   History of Present Illness: MDD and GAD   Observations/Objective: Check In: Case Manager checked in with all participants to review discharge dates, insurance authorizations, work-related documents and needs from the treatment team regarding medications. Hector stated needs and engaged in discussion.    Initial Therapeutic Activity: Counselor facilitated a check-in with Rithika to assess for safety, sobriety and medication compliance.  Counselor also inquired about Jasimine's current emotional ratings, as well as any significant changes in thoughts, feelings or behavior since previous check in.  Marrion presented for session on time and was alert, oriented x5, with no evidence or self-report of active SI/HI or A/V H.  Kelena reported compliance with medication and denied use of alcohol or illicit substances.  Raia reported scores of 0/10 for depression, 0/10 for anxiety, and 0/10 for anger/irritability.  Octa denied any recent outbursts or panic attacks.  Porschia reported that a recent success was noticing that her mood was better this morning, stating "I think being with my family and being able to talk through things has helped a lot".  Jetaime reported that a recent struggle was having a negative reaction to her medication, which led to a migraine and nausea.  She stated "I could not do anything because I felt sick, and I didn't sleep well either".  Lake reported  that her goal today is to visit a client in [place] and try to assist them with sleep problems they have been experiencing.         Second Therapeutic Activity: Counselor introduced Einar Grad, [employer] Pharmacist, to provide psychoeducation on topic of medication compliance with members today.  Jiles Garter provided psychoeducation on classes of medications such as antidepressants, antipsychotics, what symptoms they are intended to treat, and any side effects one might encounter while on a particular prescription.  Time was allowed for clients to ask any questions they might have of [facility] regarding this specialty.  Intervention was effective, as evidenced by [name] participating in discussion with speaker on the subject, reporting that when she took Abilify for the first time yesterday she had an adverse reaction, and wondered whether this is normal, or if medication should be discontinued.  Jessenya was receptive to feedback from pharmacist on how this medication is intended to treat symptoms, typical side effects one might experience, and importance of following up with provider regarding additional concerns as she continues with treatment in [place].    Third Therapeutic Activity: Counselor provided psychoeducation on subject of boundaries with group members today using a virtual handout.  This handout defined boundaries as the limits and rules that we set for ourselves within relationships, and featured a breakdown of the 3 common categories of boundaries (i.e. porous, rigid, and healthy), along with typical traits specific to each one for easy identification.  It was noted that most people have a mixture of different boundary types depending on setting, person, and culture.  Additional information was provided on the types of boundaries (i.e.  physical, intellectual, emotional, sexual, material, and time) within relationships, and what could be considered healthy versus unhealthy. Counselor tasked members with identifying  what types of boundaries they presently hold within her own support systems, the collective impact these boundaries have upon their mental health, and changes that could be made in order to more effectively communicate individual mental health needs.  Intervention was effective, as evidenced by [name] actively engaging in discussion on topic, reporting that she has a mixture of rigid and porous boundaries due to traits such as being protective of personal information, unlikely to ask for help, accepting of abuse or disrespect, and fearing rejection if she doesn't comply with others or says "No" to their requests.  Ronin reported that one reason she sought admission into [facility] is concern from family about how much she had been isolating from others, so group has allowed her to be more vulnerable and open up with supportive peers for guidance.  Clovia reported that one goal for her during treatment is to build assertive communication skills in order to set healthier boundaries with her father, who monitors her bank account.     Assessment and Plan: Counselor recommends that Shanecia remain in IOP treatment to better manage mental health symptoms, ensure stability and pursue completion of treatment plan goals. Counselor recommends adherence to crisis/safety plan, taking medications as prescribed, and following up with medical professionals if any issues arise.   Follow Up Instructions: Counselor will send Webex link for next session. Evangela was advised to call back or seek an in-person evaluation if the symptoms worsen or if the condition fails to improve as anticipated.   Collaboration of Care:   Medication Management AEB Dr. Fatima Sanger or Ricky Ala, NP                                          Case Manager AEB [name], CNA   Patient/Guardian was advised Release of Information must be obtained prior to any record release in order to collaborate their care with an outside provider. Patient/Guardian was advised  if they have not already done so to contact the registration department to sign all necessary forms in order for [place] to release information regarding their care.   Consent: Patient/Guardian gives verbal consent for treatment and assignment of benefits for services provided during this visit. Patient/Guardian expressed understanding and agreed to proceed.  I provided 180 minutes of non-face-to-face time during this encounter.   [name], [place], LCAS 06/27/22

## 2022-06-28 ENCOUNTER — Telehealth (HOSPITAL_COMMUNITY): Payer: Self-pay | Admitting: Psychiatry

## 2022-06-28 ENCOUNTER — Other Ambulatory Visit (HOSPITAL_COMMUNITY): Payer: Medicare Other | Admitting: Psychiatry

## 2022-06-29 ENCOUNTER — Other Ambulatory Visit (HOSPITAL_COMMUNITY): Payer: Medicare Other | Attending: Licensed Clinical Social Worker | Admitting: Licensed Clinical Social Worker

## 2022-06-29 DIAGNOSIS — F411 Generalized anxiety disorder: Secondary | ICD-10-CM | POA: Insufficient documentation

## 2022-06-29 DIAGNOSIS — F332 Major depressive disorder, recurrent severe without psychotic features: Secondary | ICD-10-CM | POA: Diagnosis present

## 2022-06-29 DIAGNOSIS — F329 Major depressive disorder, single episode, unspecified: Secondary | ICD-10-CM | POA: Insufficient documentation

## 2022-06-29 NOTE — Progress Notes (Signed)
Virtual Visit via Video Note   I connected with Hayley Bowman on 06/29/22 at  9:00 AM EDT by a video enabled telemedicine application and verified that I am speaking with the correct person using two identifiers.   At orientation to the IOP program, Case Manager discussed the limitations of evaluation and management by telemedicine and the availability of in person appointments. The patient expressed understanding and agreed to proceed with virtual visits throughout the duration of the program.   Location:  Patient: Patient Home Provider: [facility] [place] [facility]   History of Present Illness: MDD and GAD   Observations/Objective: Check In: Case Manager checked in with all participants to review discharge dates, insurance authorizations, work-related documents and needs from the treatment team regarding medications. Hayley Bowman stated needs and engaged in discussion.    Initial Therapeutic Activity: Counselor facilitated a check-in with Hayley Bowman to assess for safety, sobriety and medication compliance.  Counselor also inquired about Hayley Bowman's current emotional ratings, as well as any significant changes in thoughts, feelings or behavior since previous check in.  Hayley Bowman presented for session on time and was alert, oriented x5, with no evidence or self-report of active SI/HI or A/V H.  Hayley Bowman reported compliance with medication and denied use of alcohol or illicit substances.  Hayley Bowman reported scores of 0/10 for depression, 0/10 for anxiety, and 0/10 for anger/irritability.  Hayley Bowman denied any recent outbursts or panic attacks.  Hayley Bowman reported that a recent struggle was feeling ill as group progressed yesterday, and having to leave early to rest.  Hayley Bowman reported that she did begin to feel better in the afternoon.  Hayley Bowman reported that her goal today is to go to the [facility] with family.       Second Therapeutic Activity: Counselor introduced topic of assertive communication today.  Counselor shared various handouts with members virtually in  group to read along with on the subject.  These handouts defined assertive communication as a communication style in which a person stands up for their own needs and wants, while also taking into consideration the needs and wants of others, without behaving in a passive or aggressive way.  Traits of assertive communicators were highlighted such as using appropriate speaking volume, maintaining eye contact, using confident language, and avoiding interruption.  Members were also provided with tips on how to improve communication, including respecting oneself, expressing thoughts and feelings calmly, and saying "No" when necessary.  Members were given a variety of scenarios where they could practice using these tips to respond in an assertive manner.  Intervention was effective, as evidenced by Hayley Bowman participating in discussion on topic, reporting that she has a passive communication style due to traits such as prioritizing the needs of others over herself, allowing others to take advantage of her kindness, avoiding conflict when possible, and lacking confidence.  Hayley Bowman reported that this has led to issues such as lowered self-esteem, increased negative self-talk, isolation, and not standing up for herself when she feels taken advantage of by others, especially at work.  Hayley Bowman showed more effective use of assertive communication skills through engagement in roleplay activities today.    Assessment and Plan: Counselor recommends that Hayley Bowman remain in IOP treatment to better manage mental health symptoms, ensure stability and pursue completion of treatment plan goals. Counselor recommends adherence to crisis/safety plan, taking medications as prescribed, and following up with medical professionals if any issues arise.   Follow Up Instructions: Counselor will send Webex link for next session. Hayley Bowman was advised to call back  or seek an in-person evaluation if the symptoms worsen or if the condition fails to improve as  anticipated.   Collaboration of Care:   Medication Management AEB Dr. Fatima Sanger or Ricky Ala, NP                                          Case Manager AEB [name], CNA   Patient/Guardian was advised Release of Information must be obtained prior to any record release in order to collaborate their care with an outside provider. Patient/Guardian was advised if they have not already done so to contact the registration department to sign all necessary forms in order for [place] to release information regarding their care.   Consent: Patient/Guardian gives verbal consent for treatment and assignment of benefits for services provided during this visit. Patient/Guardian expressed understanding and agreed to proceed.  I provided 180 minutes of non-face-to-face time during this encounter.   [name], LCSW, LCAS 06/29/22

## 2022-07-02 ENCOUNTER — Other Ambulatory Visit (HOSPITAL_COMMUNITY): Payer: Medicare Other | Admitting: Licensed Clinical Social Worker

## 2022-07-02 DIAGNOSIS — F332 Major depressive disorder, recurrent severe without psychotic features: Secondary | ICD-10-CM

## 2022-07-02 DIAGNOSIS — F411 Generalized anxiety disorder: Secondary | ICD-10-CM | POA: Diagnosis not present

## 2022-07-02 NOTE — Progress Notes (Signed)
Virtual Visit via Video Note   I connected with Hayley Bowman on 07/02/22 at  9:00 AM EDT by a video enabled telemedicine application and verified that I am speaking with the correct person using two identifiers.   At orientation to the IOP program, Case Manager discussed the limitations of evaluation and management by telemedicine and the availability of in person appointments. The patient expressed understanding and agreed to proceed with virtual visits throughout the duration of the program.   Location:  Patient: Patient Home Provider: [facility]   History of Present Illness: MDD and GAD   Observations/Objective: Check In: Case Manager checked in with all participants to review discharge dates, insurance authorizations, work-related documents and needs from the treatment team regarding medications. Hayley Bowman stated needs and engaged in discussion.    Initial Therapeutic Activity: Counselor facilitated a check-in with Hayley Bowman to assess for safety, sobriety and medication compliance.  Counselor also inquired about Hayley Bowman's current emotional ratings, as well as any significant changes in thoughts, feelings or behavior since previous check in.  Hayley Bowman presented for session on time and was alert, oriented x5, with no evidence or self-report of active SI/HI or A/V H.  Hayley Bowman reported compliance with medication and denied use of alcohol or illicit substances.  Hayley Bowman reported scores of 0/10 for depression, 0/10 for anxiety, and 0/10 for anger/irritability.  Hayley Bowman denied any recent outbursts or panic attacks.  Hayley Bowman reported that a recent success was going to the [facility] with family over the weekend, and buying a membership to improve her self-care routine.  Hayley Bowman denied any new challenges at this time.  Hayley Bowman reported that her goal today is to go out to lunch with a friend she hasn't seen in awhile.        Second Therapeutic Activity: Counselor covered topic of conflict resolution today.  Counselor virtually shared  a handout on subject with members which warned against the 'four horsemen' of communication traps that should be avoided due to tendency to escalate and damage a relationship.  These included criticism, defensiveness, contempt, and stonewalling.  'Antidotes' to these harmful behaviors were offered as healthy replacements to improve communication and understanding, including approaching problems with a gentle startup approach, taking responsibility for one's behavior, sharing fondness/admiration, and using self-soothing to calm down and focus on the problem at hand.  Counselor encouraged members to share recent experiences with conflict that they have faced, which approach they utilized, and any changes that they would plan to implement in order to improve overall conflict resolution skills.  Intervention was effective, as evidenced by Hayley Bowman actively participating in discussion on topic, reporting she has have engaged in some of these communication traps, including stonewalling, and eventually contempt at times when she can no longer repress difficult feelings towards someone else.  Hayley Bowman reported that this subject made her reflect on the anger she felt when going through a divorce, stating "I was so angry during that time it was like a cancer growing in my mouth, and I worried that if I said something it would all spew out on someone".  Hayley Bowman reported that it takes a lot for her to get to the point where she wants to express contempt for someone else, and stated "Eventually all the stuff in the back of my mind comes out, and Ill point out everything about them that isn't perfect, and its not a good feeling because once its out, you can't take it back".  Hayley Bowman reported that she is motivated to  stop repressing thoughts and feelings as she continues with therapy, and adopt a more assertive approach in order to express feelings directly, but respectfully with supports when problems arise so that they can seek resolution  together.    Assessment and Plan: Counselor recommends that Hayley Bowman remain in IOP treatment to better manage mental health symptoms, ensure stability and pursue completion of treatment plan goals. Counselor recommends adherence to crisis/safety plan, taking medications as prescribed, and following up with medical professionals if any issues arise.   Follow Up Instructions: Counselor will send Webex link for next session. Hayley Bowman was advised to call back or seek an in-person evaluation if the symptoms worsen or if the condition fails to improve as anticipated.   Collaboration of Care:   Medication Management AEB Dr. Fatima Sanger or Ricky Ala, NP                                          Case Manager AEB [name], CNA   Patient/Guardian was advised Release of Information must be obtained prior to any record release in order to collaborate their care with an outside provider. Patient/Guardian was advised if they have not already done so to contact the registration department to sign all necessary forms in order for [place] to release information regarding their care.   Consent: Patient/Guardian gives verbal consent for treatment and assignment of benefits for services provided during this visit. Patient/Guardian expressed understanding and agreed to proceed.  I provided 180 minutes of non-face-to-face time during this encounter.   [name], LCSW, LCAS 07/02/22

## 2022-07-03 ENCOUNTER — Other Ambulatory Visit (HOSPITAL_COMMUNITY): Payer: Medicare Other | Admitting: Licensed Clinical Social Worker

## 2022-07-03 DIAGNOSIS — F411 Generalized anxiety disorder: Secondary | ICD-10-CM | POA: Diagnosis not present

## 2022-07-03 DIAGNOSIS — F332 Major depressive disorder, recurrent severe without psychotic features: Secondary | ICD-10-CM

## 2022-07-03 NOTE — Progress Notes (Signed)
Virtual Visit via Video Note   I connected with Maevyn L. Madej on 07/03/22 at  9:00 AM EDT by a video enabled telemedicine application and verified that I am speaking with the correct person using two identifiers.   At orientation to the IOP program, Case Manager discussed the limitations of evaluation and management by telemedicine and the availability of in person appointments. The patient expressed understanding and agreed to proceed with virtual visits throughout the duration of the program.   Location:  Patient: Patient Home Provider: [facility] [place] [facility]   History of Present Illness: MDD and GAD   Observations/Objective: Check In: Case Manager checked in with all participants to review discharge dates, insurance authorizations, work-related documents and needs from the treatment team regarding medications. Trianna stated needs and engaged in discussion.    Initial Therapeutic Activity: Counselor facilitated a check-in with Shalika to assess for safety, sobriety and medication compliance.  Counselor also inquired about Maelin's current emotional ratings, as well as any significant changes in thoughts, feelings or behavior since previous check in.  Charelle presented for session on time and was alert, oriented x5, with no evidence or self-report of active SI/HI or A/V H.  Johanne reported compliance with medication and denied use of alcohol or illicit substances.  Noriko reported scores of 0/10 for depression, 0/10 for anxiety, and 0/10 for anger/irritability.  Natausha denied any recent outbursts or panic attacks.  Alisea reported that a recent success was going out to lunch with a friend to catch up yesterday, and then doing some shopping together.  Kisha denied any new struggles at this time.  Basha reported that her goal today is to outreach her best friend, and get out of the house to return something.        Second Therapeutic Activity: Counselor provided demonstration of relaxation technique known as mindful breathing  meditation to help members increase sense of calm, resiliency, and control.  Counselor guided members through process of getting comfortable, achieving a relaxed breathing rhythm, and focusing on this for several minutes, allowing troubling thoughts and feelings to come and go without rumination.  Counselor processed effectiveness of activity afterward in discussion with members, including how this impacted their mental state, whether it was difficult to stay focused, and if they plan to include it in self-care routine to improve day-to-day coping.  Intervention was effective, as evidenced by [name] participating in exercise, and reporting that she enjoyed it, and would add it to her coping skills, stating "I feel completely relaxed now.  I almost fell asleep".     Third Therapeutic Activity: Counselor introduced [employer], [place] Chaplain to provide psychoeducation on topic of Grief and Loss with members today.  [name] began discussion by checking in with the group about their baseline mood today, general thoughts on what grief means to them and how it has affected them personally in the past.  [name] provided information on how the process of grief/loss can differ depending upon one's unique culture, and categories of loss one could experience (i.e. loss of a person, animal, relationship, job, identity, etc).  [name] encouraged members to be mindful of how pervasive loss can be, and how to recognize signs which could indicate that this is having an impact on one's overall mental health and wellbeing.  Intervention was effective, as evidenced by [name] participating in discussion with speaker on the subject, reporting that this topic made her think of the separation from her husband a few years ago, and how he "  Wiped out my account".  Luiza reported that this combined loss of relationship and financial security was challenging for her.  Shaili was receptive to feedback from chaplain on how to cope with losses  like this is a healthy manner.    Assessment and Plan: Counselor recommends that Deshanna remain in IOP treatment to better manage mental health symptoms, ensure stability and pursue completion of treatment plan goals. Counselor recommends adherence to crisis/safety plan, taking medications as prescribed, and following up with medical professionals if any issues arise.   Follow Up Instructions: Counselor will send Webex link for next session. Seleena was advised to call back or seek an in-person evaluation if the symptoms worsen or if the condition fails to improve as anticipated.   Collaboration of Care:   Medication Management AEB Dr. Fatima Sanger or Ricky Ala, NP                                          Case Manager AEB [name], CNA   Patient/Guardian was advised Release of Information must be obtained prior to any record release in order to collaborate their care with an outside provider. Patient/Guardian was advised if they have not already done so to contact the registration department to sign all necessary forms in order for [place] to release information regarding their care.   Consent: Patient/Guardian gives verbal consent for treatment and assignment of benefits for services provided during this visit. Patient/Guardian expressed understanding and agreed to proceed.  I provided 180 minutes of non-face-to-face time during this encounter.   [name], LCSW, LCAS 07/03/22

## 2022-07-04 ENCOUNTER — Other Ambulatory Visit (HOSPITAL_COMMUNITY): Payer: Medicare Other | Admitting: Licensed Clinical Social Worker

## 2022-07-04 DIAGNOSIS — F411 Generalized anxiety disorder: Secondary | ICD-10-CM

## 2022-07-04 DIAGNOSIS — F332 Major depressive disorder, recurrent severe without psychotic features: Secondary | ICD-10-CM

## 2022-07-04 NOTE — Progress Notes (Signed)
Virtual Visit via Video Note   I connected with Amal L. Gellerman on 07/04/22 at  9:00 AM EDT by a video enabled telemedicine application and verified that I am speaking with the correct person using two identifiers.   At orientation to the IOP program, Case Manager discussed the limitations of evaluation and management by telemedicine and the availability of in person appointments. The patient expressed understanding and agreed to proceed with virtual visits throughout the duration of the program.   Location:  Patient: Patient Home Provider: [facility] [place] [facility]   History of Present Illness: MDD and GAD   Observations/Objective: Check In: Case Manager checked in with all participants to review discharge dates, insurance authorizations, work-related documents and needs from the treatment team regarding medications. Keala stated needs and engaged in discussion.    Initial Therapeutic Activity: Counselor facilitated a check-in with Myrtle to assess for safety, sobriety and medication compliance.  Counselor also inquired about Imogean's current emotional ratings, as well as any significant changes in thoughts, feelings or behavior since previous check in.  Tamyka presented for session on time and was alert, oriented x5, with no evidence or self-report of active SI/HI or A/V H.  Avacyn reported compliance with medication and denied use of alcohol or illicit substances.  Adah reported scores of 0/10 for depression, 0/10 for anxiety, and 0/10 for anger/irritability.  Nonna denied any recent outbursts or panic attacks.  Aleasha reported that a recent success was outreaching a friend to catch up and make plans yesterday for lunch.  Merelin denied any new struggles.  Rochanda reported that her goal today is to run some errands, drop off donations, and water her plants.         Second Therapeutic Activity: Counselor offered to teach group members an ACT relaxation technique today to aid in managing difficult thoughts, feelings, urges, and  sensations.  Counselor guided members through process of getting comfortable, achieving relaxing breathing rhythm, and then maintaining this throughout activity.  Counselor invited members to imagine a gently flowing stream in their mind with leaves floating upon it, and when any thoughts, feelings, urges, or sensations arose, good or bad, they were instructed to visualize placing them on these passing leaves over course of practice.  Intervention was effective, as evidenced by [name] successfully participating in activity and reporting that she was able to visualize a stream she had been at with friends on a [place] in the past.  She reported that it was a beautiful scene that relaxed her and helped distract her from feelings of grief that arose in earlier discussion.  She expressed interest in practicing this on her own.    Assessment and Plan: Counselor recommends that Yolander remain in IOP treatment to better manage mental health symptoms, ensure stability and pursue completion of treatment plan goals. Counselor recommends adherence to crisis/safety plan, taking medications as prescribed, and following up with medical professionals if any issues arise.   Follow Up Instructions: Counselor will send Webex link for next session. Amritha was advised to call back or seek an in-person evaluation if the symptoms worsen or if the condition fails to improve as anticipated.   Collaboration of Care:   Medication Management AEB Dr. Fatima Sanger or Ricky Ala, NP                                          Case Manager AEB  [name], CNA   Patient/Guardian was advised Release of Information must be obtained prior to any record release in order to collaborate their care with an outside provider. Patient/Guardian was advised if they have not already done so to contact the registration department to sign all necessary forms in order for [place] to release information regarding their care.   Consent: Patient/Guardian gives  verbal consent for treatment and assignment of benefits for services provided during this visit. Patient/Guardian expressed understanding and agreed to proceed.  I provided 180 minutes of non-face-to-face time during this encounter.   [name], LCSW, LCAS 07/04/22

## 2022-07-05 ENCOUNTER — Other Ambulatory Visit (HOSPITAL_COMMUNITY): Payer: Medicare Other | Admitting: Licensed Clinical Social Worker

## 2022-07-05 DIAGNOSIS — F332 Major depressive disorder, recurrent severe without psychotic features: Secondary | ICD-10-CM

## 2022-07-05 DIAGNOSIS — F411 Generalized anxiety disorder: Secondary | ICD-10-CM | POA: Diagnosis not present

## 2022-07-05 NOTE — Progress Notes (Signed)
Virtual Visit via Video Note   I connected with Hayley Bowman on 07/05/22 at  9:00 AM EDT by a video enabled telemedicine application and verified that I am speaking with the correct person using two identifiers.   At orientation to the IOP program, Case Manager discussed the limitations of evaluation and management by telemedicine and the availability of in person appointments. The patient expressed understanding and agreed to proceed with virtual visits throughout the duration of the program.   Location:  Patient: Patient Home Provider: [facility] [place] [facility]   History of Present Illness: MDD and GAD   Observations/Objective: Check In: Case Manager checked in with all participants to review discharge dates, insurance authorizations, work-related documents and needs from the treatment team regarding medications. Hayley Bowman stated needs and engaged in discussion.    Initial Therapeutic Activity: Counselor facilitated a check-in with Hayley Bowman to assess for safety, sobriety and medication compliance.  Counselor also inquired about Hayley Bowman's current emotional ratings, as well as any significant changes in thoughts, feelings or behavior since previous check in.  Hayley Bowman presented for session on time and was alert, oriented x5, with no evidence or self-report of active SI/HI or A/V H.  Hayley Bowman reported compliance with medication and denied use of alcohol or illicit substances.  Hayley Bowman reported scores of 0/10 for depression, 0/10 for anxiety, and 0/10 for anger/irritability.  Hayley Bowman denied any recent outbursts or panic attacks.  Hayley Bowman reported that a recent success was picking up her niece and running errands together yesterday, stating "We had a lot of fun".  Hayley Bowman denied any new struggles at this time.  Hayley Bowman reported that her goal today is to followup with a potential therapist referral and meet up with a friend afterward.       Second Therapeutic Activity: Counselor covered topic of distress tolerance skills today.  Counselor utilized  a DBT handout which explained how distressing situations don't always have quick solutions, so the only choice is to sit with uncomfortable emotions until they pass.  Counselor offered the IMPROVE acronym as a solution to this problem, which outlined various skills (i.e. Imagery, Meaning, Prayer, Relaxation, 'One thing in the moment', Vacation, and Encouragement) that could be explored in order to improve ability to tolerate discomfort.  Counselor tasked members with identifying personalized strategies for each category which could have been implemented to handle a recent challenge more effectively.  Intervention was effective, as evidenced by Hayley Bowman actively engaging in discussion on subject, reporting that taking a test is a distressing situation for her, as the performance pressure can make her anxious.  Hayley Bowman was able to identify several strategies for handling a similar struggle in the future, including reflecting upon the silver lining to a difficult scenario, practicing deep breathing, saying a prayer to her higher power for relief, doing some painting, and planning a day trip out to eat with positive friends.    Assessment and Plan: Counselor recommends that Hayley Bowman remain in IOP treatment to better manage mental health symptoms, ensure stability and pursue completion of treatment plan goals. Counselor recommends adherence to crisis/safety plan, taking medications as prescribed, and following up with medical professionals if any issues arise.   Follow Up Instructions: Counselor will send Webex link for next session. Hayley Bowman was advised to call back or seek an in-person evaluation if the symptoms worsen or if the condition fails to improve as anticipated.   Collaboration of Care:   Medication Management AEB Dr. Fatima Sanger or Ricky Ala, NP  Case Manager AEB [name], CNA   Patient/Guardian was advised Release of Information must be obtained prior to any record  release in order to collaborate their care with an outside provider. Patient/Guardian was advised if they have not already done so to contact the registration department to sign all necessary forms in order for [place] to release information regarding their care.   Consent: Patient/Guardian gives verbal consent for treatment and assignment of benefits for services provided during this visit. Patient/Guardian expressed understanding and agreed to proceed.  I provided 180 minutes of non-face-to-face time during this encounter.   [name], LCSW, LCAS 07/05/22

## 2022-07-06 ENCOUNTER — Other Ambulatory Visit (HOSPITAL_COMMUNITY): Payer: Medicare Other | Admitting: Psychiatry

## 2022-07-06 DIAGNOSIS — F332 Major depressive disorder, recurrent severe without psychotic features: Secondary | ICD-10-CM

## 2022-07-06 DIAGNOSIS — F411 Generalized anxiety disorder: Secondary | ICD-10-CM | POA: Diagnosis not present

## 2022-07-06 NOTE — Progress Notes (Signed)
Virtual Visit via Video Note   I connected with Jeffrie L. Foxworth on 07/06/22 at  9:00 AM EDT by a video enabled telemedicine application and verified that I am speaking with the correct person using two identifiers.   At orientation to the IOP program, Case Manager discussed the limitations of evaluation and management by telemedicine and the availability of in person appointments. The patient expressed understanding and agreed to proceed with virtual visits throughout the duration of the program.   Location:  Patient: Patient Home Provider: [facility]   History of Present Illness: MDD and GAD   Observations/Objective: Check In: Case Manager checked in with all participants to review discharge dates, insurance authorizations, work-related documents and needs from the treatment team regarding medications. Dyasia stated needs and engaged in discussion.    Initial Therapeutic Activity: Counselor facilitated a check-in with Katianna to assess for safety, sobriety and medication compliance.  Counselor also inquired about Hanaan's current emotional ratings, as well as any significant changes in thoughts, feelings or behavior since previous check in.  Isobella presented for session on time and was alert, oriented x5, with no evidence or self-report of active SI/HI or A/V H.  Mckinley reported compliance with medication and denied use of alcohol or illicit substances.  Amberlee reported scores of 0/10 for depression, 0/10 for anxiety, and 0/10 for anger/irritability.  Briony denied any recent outbursts or panic attacks.  Asna reported that a recent success was completing laundry, making calls for therapy, and spending time with her niece yesterday.  Sidny denied any new challenges.  Magdiel reported that her goal today is to get her nails done, and then spend time with her sister helping to make a decorative wreath.         Second Therapeutic Activity: Psycho-educational portion of group was provided by Christie Beckers,  Mudlogger of community education with [employer].  Alexandra provided information on history of her local agency, mission statement, and the variety of unique services offered which group members might find beneficial to engage in, including both virtual and in-person support groups, as well as peer support program for mentoring.  Alexandra offered time to answer member's questions regarding services and encouraged them to consider utilizing these services to assist in working towards their individual wellness goals.  Intervention was effective, as evidenced by [name] participating in discussion with speaker on the subject, reporting that she recently completed an intake with [facility], and was excited to begin engaging in their services upon upcoming discharge from [place] next week.    Third Therapeutic Activity: Counselor introduced topic of creating mental health maintenance plan today.  Counselor provided handout on subject to members, which stressed the importance of maintaining one's mental health in a similar way to using diet and exercise to ensure physical health.  Counselor walked members through process of identifying triggers which could worsen symptoms, including specific people, places, and things one needs to avoid.  Members were also tasked with identifying warning signs such as thoughts, feelings, or behaviors which could indicate mental health is at increased risk.  Counselor also facilitated conversation on self-care activities and coping strategies which members have previously utilized in the past, are currently using in daily routine, or plan to use soon to assist with managing problems or symptoms when/if they appear.  Counselor encouraged members to revisit their maintenance plan often and make changes as needed to ensure day to day stability.  Intervention was effective, as evidenced by [name] participating in activity  and creating a comprehensive plan, including identification of  triggers such as experiencing feelings of grief or loss, and warning signs including staying up all night, lacking motivation, and ruminating on stressors.  Shereena also reported that she would make an effort to set aside time for self-care activities such as adopting a healthier diet, painting, spending more time with positive supports throughout the week and use coping skills such as mindful breathing meditation or opening up honestly to positive people to manage stressors.    Assessment and Plan: Counselor recommends that Felishia remain in IOP treatment to better manage mental health symptoms, ensure stability and pursue completion of treatment plan goals. Counselor recommends adherence to crisis/safety plan, taking medications as prescribed, and following up with medical professionals if any issues arise.   Follow Up Instructions: Counselor will send Webex link for next session. Fe was advised to call back or seek an in-person evaluation if the symptoms worsen or if the condition fails to improve as anticipated.   Collaboration of Care:   Medication Management AEB Dr. Fatima Sanger or Ricky Ala, NP                                          Case Manager AEB [name], CNA   Patient/Guardian was advised Release of Information must be obtained prior to any record release in order to collaborate their care with an outside provider. Patient/Guardian was advised if they have not already done so to contact the registration department to sign all necessary forms in order for [place] to release information regarding their care.   Consent: Patient/Guardian gives verbal consent for treatment and assignment of benefits for services provided during this visit. Patient/Guardian expressed understanding and agreed to proceed.  I provided 180 minutes of non-face-to-face time during this encounter.   [name], LCSW, LCAS 07/06/22

## 2022-07-09 ENCOUNTER — Other Ambulatory Visit (HOSPITAL_COMMUNITY): Payer: Medicare Other | Admitting: Licensed Clinical Social Worker

## 2022-07-09 DIAGNOSIS — F411 Generalized anxiety disorder: Secondary | ICD-10-CM | POA: Diagnosis not present

## 2022-07-09 DIAGNOSIS — F332 Major depressive disorder, recurrent severe without psychotic features: Secondary | ICD-10-CM

## 2022-07-09 NOTE — Progress Notes (Signed)
Virtual Visit via Video Note   I connected with Solenne L. Lisanti on 07/09/22 at  9:00 AM EDT by a video enabled telemedicine application and verified that I am speaking with the correct person using two identifiers.   At orientation to the IOP program, Case Manager discussed the limitations of evaluation and management by telemedicine and the availability of in person appointments. The patient expressed understanding and agreed to proceed with virtual visits throughout the duration of the program.   Location:  Patient: Patient Home Provider: [facility]    History of Present Illness: MDD and GAD   Observations/Objective: Check In: Case Manager checked in with all participants to review discharge dates, insurance authorizations, work-related documents and needs from the treatment team regarding medications. Muntaha stated needs and engaged in discussion.    Initial Therapeutic Activity: Counselor facilitated a check-in with Merlynn to assess for safety, sobriety and medication compliance.  Counselor also inquired about Adelin's current emotional ratings, as well as any significant changes in thoughts, feelings or behavior since previous check in.  Maraiah presented for session on time and was alert, oriented x5, with no evidence or self-report of active SI/HI or A/V H.  Natayah reported compliance with medication and denied use of alcohol or illicit substances.  Laporcha reported scores of 0/10 for depression, 0/10 for anxiety, and 0/10 for anger/irritability.  Aisosa denied any recent outbursts or panic attacks.  Ahtziry reported that a recent success was watching the super bowl over the weekend with family, stating "We had a good time".  Berklie reported that a recent struggle was experiencing a "moment of anger" yesterday when talking to her sister about financial matters and feeling overwhelmed.  Tasya reported that her goal today is to do some unpacking, and then make important phone calls.         Second  Therapeutic Activity: Counselor engaged the group in discussion on managing work/life balance today to improve mental health and wellness.  Counselor explained how finding balance between responsibilities at home and work place can be challenging, and lead to increased stress.  Counselor facilitated discussion on what challenges members are currently, or have historically faced.  Counselor also discussed strategies for improving work/life balance while members work on their mental health during treatment.  Some of these included keeping track of time management; creating a list of priorities and scaling importance; setting realistic, measurable goals each day; establishing boundaries; taking care of health needs; and nurturing relationships at home and work for support.  Counselor inquired about areas where members feel they are excelling, as well as areas they could focus on during treatment. Intervention was effective, as evidenced by [name] actively participating in discussion on topic and reporting that this is a problem she has dealt with for some time, stating "I have trouble with this.  I've been bad about getting to focused on my job and forgetting about the social aspects of my life".  Darrel reported that she experienced several symptoms of burnout, including numerous warning signs such as checking her phone for work related communication during downtime, skipping lunch, working long hours and taking work home with her at the end of the day.  Makali was receptive to suggestions offered today for addressing work life imbalance, including prioritizing time for vacations such as going to [employer], keeping an activities time log to monitor work life balance more closely, creating a daily to do list to structure the day more realistically with emphasis on self-care, in  addition to adjusting diet to improve energy, focus, and motivation, and visiting the gym more often to ensure healthy outlet for stress.        Assessment and Plan: Counselor recommends that Wilmetta remain in IOP treatment to better manage mental health symptoms, ensure stability and pursue completion of treatment plan goals. Counselor recommends adherence to crisis/safety plan, taking medications as prescribed, and following up with medical professionals if any issues arise.   Follow Up Instructions: Counselor will send Webex link for next session. Raigan was advised to call back or seek an in-person evaluation if the symptoms worsen or if the condition fails to improve as anticipated.   Collaboration of Care:   Medication Management AEB Dr. Fatima Sanger or Ricky Ala, NP                                          Case Manager AEB [name], CNA   Patient/Guardian was advised Release of Information must be obtained prior to any record release in order to collaborate their care with an outside provider. Patient/Guardian was advised if they have not already done so to contact the registration department to sign all necessary forms in order for [place] to release information regarding their care.   Consent: Patient/Guardian gives verbal consent for treatment and assignment of benefits for services provided during this visit. Patient/Guardian expressed understanding and agreed to proceed.  I provided 180 minutes of non-face-to-face time during this encounter.   [name], LCSW, LCAS 07/09/22

## 2022-07-10 ENCOUNTER — Encounter (HOSPITAL_COMMUNITY): Payer: Self-pay | Admitting: Licensed Clinical Social Worker

## 2022-07-10 ENCOUNTER — Other Ambulatory Visit (HOSPITAL_COMMUNITY): Payer: Medicare Other | Admitting: Licensed Clinical Social Worker

## 2022-07-10 DIAGNOSIS — F338 Other recurrent depressive disorders: Secondary | ICD-10-CM

## 2022-07-10 DIAGNOSIS — F332 Major depressive disorder, recurrent severe without psychotic features: Secondary | ICD-10-CM

## 2022-07-10 DIAGNOSIS — F411 Generalized anxiety disorder: Secondary | ICD-10-CM | POA: Diagnosis not present

## 2022-07-10 MED ORDER — ARIPIPRAZOLE 2 MG PO TABS: 2.0000 mg | ORAL_TABLET | Freq: Every day | ORAL | 0 refills | Status: DC

## 2022-07-10 NOTE — Progress Notes (Signed)
Virtual Visit via Video Note   I connected with Cathaleen L. Trefry on 07/10/22 at  9:00 AM EDT by a video enabled telemedicine application and verified that I am speaking with the correct person using two identifiers.   At orientation to the IOP program, Case Manager discussed the limitations of evaluation and management by telemedicine and the availability of in person appointments. The patient expressed understanding and agreed to proceed with virtual visits throughout the duration of the program.   Location:  Patient: Patient Home Provider: [facility] [place] [facility]   History of Present Illness: MDD and GAD   Observations/Objective: Check In: Case Manager checked in with all participants to review discharge dates, insurance authorizations, work-related documents and needs from the treatment team regarding medications. Michalea stated needs and engaged in discussion.    Initial Therapeutic Activity: Counselor facilitated a check-in with Nolani to assess for safety, sobriety and medication compliance.  Counselor also inquired about Hope's current emotional ratings, as well as any significant changes in thoughts, feelings or behavior since previous check in.  Phallon presented for session on time and was alert, oriented x5, with no evidence or self-report of active SI/HI or A/V H.  Corie reported compliance with medication and denied use of alcohol or illicit substances.  Amayrany reported scores of 0/10 for depression, 0/10 for anxiety, and 0/10 for anger/irritability.  Cennie denied any recent outbursts or panic attacks.  Girtie reported that a recent success was going to a dentist appointment yesterday, although it was initially difficult to find someone in network to schedule with.  Jniyah reported that her goal today is to turn in paperwork to our case manager at the office, and make some phone calls to insurance.         Second Therapeutic Activity: Counselor introduced topic of anger management today.  Counselor virtually  shared a handout with members on this subject featuring a variety of coping skills, and facilitated discussion on these approaches.  Examples included raising awareness of anger triggers, practicing deep breathing, keeping an anger log to better understand episodes, using diversion activities to distract oneself for 30 minutes, taking a time out when necessary, and being mindful of warning signs tied to thoughts or behavior.  Counselor inquired about which techniques group members have used before, what has proved to be helpful, what their unique warning signs might be, as well as what they will try out in the future to assist with de-escalation.  Intervention was effective, as evidenced by [name] participating in discussion on activity, and reporting that this has become a growing problem for her in recent years, stating "I must have been holding in some anger, but didn't want to acknowledge it.  It was affecting everything in my life.  I began to isolate myself and held everything in".  Starrla reported that her triggers include having someone order her around, being unfairly criticized or called hurtful names.  Chemeka reported that she will work to manage anger more effectively by using coping skills such as processing anger/outburst events with an anger journal, practicing deep breathing, or meditation.  Assessment and Plan: Counselor recommends that Erryn remain in IOP treatment to better manage mental health symptoms, ensure stability and pursue completion of treatment plan goals. Counselor recommends adherence to crisis/safety plan, taking medications as prescribed, and following up with medical professionals if any issues arise.   Follow Up Instructions: Counselor will send Webex link for next session. Petronilla was advised to call back or seek an in-person  evaluation if the symptoms worsen or if the condition fails to improve as anticipated.   Collaboration of Care:   Medication Management AEB Dr. Fatima Sanger  or Ricky Ala, NP                                          Case Manager AEB [name], CNA   Patient/Guardian was advised Release of Information must be obtained prior to any record release in order to collaborate their care with an outside provider. Patient/Guardian was advised if they have not already done so to contact the registration department to sign all necessary forms in order for [place] to release information regarding their care.   Consent: Patient/Guardian gives verbal consent for treatment and assignment of benefits for services provided during this visit. Patient/Guardian expressed understanding and agreed to proceed.  I provided 180 minutes of non-face-to-face time during this encounter.   [name], [place], LCAS 07/10/22
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
## 2022-07-11 ENCOUNTER — Other Ambulatory Visit (HOSPITAL_COMMUNITY): Payer: Medicare Other | Admitting: Psychiatry

## 2022-07-11 DIAGNOSIS — F411 Generalized anxiety disorder: Secondary | ICD-10-CM

## 2022-07-11 DIAGNOSIS — F332 Major depressive disorder, recurrent severe without psychotic features: Secondary | ICD-10-CM

## 2022-07-11 NOTE — Progress Notes (Signed)
Virtual Visit via Video Note  I connected with [name] on @TODAY$ @ at  9:00 AM EST by a video enabled telemedicine application and verified that I am speaking with the correct person using two identifiers.  Location: Patient: at home Provider: at office   I discussed the limitations of evaluation and management by telemedicine and the availability of in person appointments. The patient expressed understanding and agreed to proceed.  I discussed the assessment and treatment plan with the patient. The patient was provided an opportunity to ask questions and all were answered. The patient agreed with the plan and demonstrated an understanding of the instructions.   The patient was advised to call back or seek an in-person evaluation if the symptoms worsen or if the condition fails to improve as anticipated.  I provided 30 minutes of non-face-to-face time during this encounter.   Carlis Abbott, RITA, M.Ed, CNA   Patient ID: Hayley Bowman, female   DOB: 07/22/1950, 72 y.o.   MRN: [id number] As per previous CCA states:  This is a 72 yr old, divorced, Caucasian female who was referred by [name], DO [facility]) treatment for worsening depressive symptoms.  Reports sx's started worsening whenever the season change.  Stressors:  1) Finances:  Pt is retired, but works prn.  "I worry about money b/c I am retired."  2) Unresolved grief/loss issues:  2 weeks ago sister's mother-in-law died.  "I am angry at the hospital b/c I don't think they managed her very well."  Pt is fearful her 43 yr old dog is going to die.  "My sister and I have the same kind of dog and she just informed me that she euthanized her dog recently.  If mine die, I just don't know what will happen to me."  71) 9 yr old father calls pt a lot.  "I am the oldest daughter and he depends on me and wants me to be like I used to be.  I am depressed and he just doesn't understand that I can't do what I used to do."  Pt denies any previous psych  admits or suicide attempts or gestures.  Hx of seeing a therapist and a psychiatrist.  Reports ended services last yr b/c she was feeling better.  PCP has pt on Wellbutrin XL, Trazodone and Melatonin.  Support system includes her siblings.  Temporarily residing with sister during this episode.   Pt attended all scheduled days in virtual MH-IOP.  Reports feeling much better.  "My mom said the other day that I sound like my old self."  On a scale of 1-10 (10 being the worse); pt rates her anxiety and depression at a zero.  Denies SI/HI or A/V hallucinations.  Reports that the groups were very helpful.  "I was at the end of my rope, but I came here and learned so many skills."  A:  D/C today.  F/U with [facility] on 07-19-22 @ 1pm and Dr. Nelida Gores on 07-24-22 @ 10 a.m (virtual).  Pt was given all the clinic paperwork to complete.  Pt did complete and returned all of it; given to front desk staff.  Strongly encouraged support groups through [facility]  [phone].  R:  Pt receptive.  [name], M.Ed,CNA

## 2022-07-11 NOTE — Patient Instructions (Signed)
D:  Patient completed MH-IOP today.  A:  Discharge today.  Follow up with [facility] on 07-19-22 @ 1pm and Dr. Nelida Gores on 07-24-22 @ 10 a.m (virtual).  Encouraged support groups through [facility].  R:  Patient receptive.

## 2022-07-11 NOTE — Progress Notes (Signed)
Virtual Visit via Video Note   I connected with Cera L. Helwig on 07/11/22 at  9:00 AM EDT by a video enabled telemedicine application and verified that I am speaking with the correct person using two identifiers.   At orientation to the IOP program, Case Manager discussed the limitations of evaluation and management by telemedicine and the availability of in person appointments. The patient expressed understanding and agreed to proceed with virtual visits throughout the duration of the program.   Location:  Patient: Patient Home Provider: [facility] [place]   History of Present Illness: MDD and GAD   Observations/Objective: Check In: Case Manager checked in with all participants to review discharge dates, insurance authorizations, work-related documents and needs from the treatment team regarding medications. Lilja stated needs and engaged in discussion.    Initial Therapeutic Activity: Counselor facilitated a check-in with Toya to assess for safety, sobriety and medication compliance.  Counselor also inquired about Malka's current emotional ratings, as well as any significant changes in thoughts, feelings or behavior since previous check in.  Danele presented for session on time and was alert, oriented x5, with no evidence or self-report of active SI/HI or A/V H.  Paytyn reported compliance with medication and denied use of alcohol or illicit substances.  Karenann reported scores of 0/10 for depression, 0/10 for anxiety, and 0/10 for anger/irritability.  Maddalynn denied any recent outbursts or panic attacks.  Cloye reported that a recent success was doing laundry done, making phone calls, and relaxing for a bit yesterday.  Shavone denied any new challenges at this time.  Keziyah reported that her goal today is to visit the dentist to have a root canal done.        Second Therapeutic Activity: Counselor introduced Einar Grad, [employer] Pharmacist, to provide psychoeducation on topic of medication compliance with members today.   Jiles Garter provided psychoeducation on classes of medications such as antidepressants, antipsychotics, what symptoms they are intended to treat, and any side effects one might encounter while on a particular prescription.  Time was allowed for clients to ask any questions they might have of [facility] regarding this specialty.  Intervention effectiveness could not be measured, as [name] did not participate.    Third Therapeutic Activity: Counselor also acknowledged a graduating group member by prompting this member to reflect on progress made since beginning the [facility] program, notable takeaways from sessions attended, challenges overcome, and plan for continued care following discharge. Counselor and group members shared observations of growth, words of encouragement and support as this member transitioned out of the program today.  Intervention was effective, as evidenced by [name] participating in activity, reporting that [place] has been extremely helpful for her, as it allowed her to open up honestly about challenges she has faced, and receive support from both counselor and other members.  Ashanna reported that she will miss the peers and structure of this group, but plans to continue with support groups by connecting with [employer].    Assessment and Plan: Jozalynn has completed MHIOP and will be discharged from program today. Counselor recommends adherence to crisis/safety plan, taking medications as prescribed, and following up with medical professionals if any issues arise.   Follow Up Instructions: Rennee was advised to call back or seek an in-person evaluation if the symptoms worsen or if the condition fails to improve as anticipated.   Collaboration of Care:   Medication Management AEB Dr. Fatima Sanger or Ricky Ala, NP  Case Manager AEB [name], CNA   Patient/Guardian was advised Release of Information must be obtained prior to any record release in order to  collaborate their care with an outside provider. Patient/Guardian was advised if they have not already done so to contact the registration department to sign all necessary forms in order for [place] to release information regarding their care.   Consent: Patient/Guardian gives verbal consent for treatment and assignment of benefits for services provided during this visit. Patient/Guardian expressed understanding and agreed to proceed.  I provided 180 minutes of non-face-to-face time during this encounter.   [name], LCSW, LCAS 07/11/22

## 2022-07-12 ENCOUNTER — Other Ambulatory Visit (HOSPITAL_COMMUNITY): Payer: Medicare Other | Admitting: Licensed Clinical Social Worker

## 2022-07-13 ENCOUNTER — Other Ambulatory Visit (HOSPITAL_COMMUNITY): Payer: Medicare Other | Admitting: Licensed Clinical Social Worker

## 2022-07-16 ENCOUNTER — Other Ambulatory Visit (HOSPITAL_COMMUNITY): Payer: Self-pay

## 2022-07-16 ENCOUNTER — Other Ambulatory Visit (HOSPITAL_COMMUNITY): Payer: Medicare Other | Admitting: Licensed Clinical Social Worker

## 2022-07-17 ENCOUNTER — Other Ambulatory Visit (HOSPITAL_COMMUNITY): Payer: Medicare Other | Admitting: Licensed Clinical Social Worker

## 2022-07-18 ENCOUNTER — Other Ambulatory Visit (HOSPITAL_COMMUNITY): Payer: Medicare Other | Admitting: Licensed Clinical Social Worker

## 2022-07-19 ENCOUNTER — Ambulatory Visit (HOSPITAL_COMMUNITY): Payer: Medicare Other | Admitting: Clinical

## 2022-07-19 ENCOUNTER — Other Ambulatory Visit (HOSPITAL_COMMUNITY): Payer: Medicare Other | Admitting: Licensed Clinical Social Worker

## 2022-07-19 ENCOUNTER — Ambulatory Visit: Payer: Medicare Other | Admitting: Psychiatry

## 2022-07-19 DIAGNOSIS — F331 Major depressive disorder, recurrent, moderate: Secondary | ICD-10-CM

## 2022-07-19 DIAGNOSIS — F411 Generalized anxiety disorder: Secondary | ICD-10-CM

## 2022-07-20 ENCOUNTER — Encounter (HOSPITAL_COMMUNITY): Payer: Self-pay

## 2022-07-20 ENCOUNTER — Other Ambulatory Visit (HOSPITAL_COMMUNITY): Payer: Medicare Other | Admitting: Licensed Clinical Social Worker
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
## 2022-07-24 ENCOUNTER — Other Ambulatory Visit (HOSPITAL_COMMUNITY): Payer: Self-pay

## 2022-07-24 ENCOUNTER — Telehealth (HOSPITAL_COMMUNITY): Payer: Medicare Other | Admitting: Psychiatry

## 2022-07-24 ENCOUNTER — Encounter (HOSPITAL_COMMUNITY): Payer: Self-pay | Admitting: Psychiatry

## 2022-07-24 DIAGNOSIS — F411 Generalized anxiety disorder: Secondary | ICD-10-CM

## 2022-07-24 DIAGNOSIS — F332 Major depressive disorder, recurrent severe without psychotic features: Secondary | ICD-10-CM

## 2022-07-24 DIAGNOSIS — Z79899 Other long term (current) drug therapy: Secondary | ICD-10-CM

## 2022-07-24 DIAGNOSIS — F338 Other recurrent depressive disorders: Secondary | ICD-10-CM

## 2022-07-24 MED ORDER — TRAZODONE HCL 50 MG PO TABS: 50.0000 mg | ORAL_TABLET | Freq: Every evening | ORAL | 2 refills | Status: DC | PRN

## 2022-07-24 MED ORDER — HYDROXYZINE HCL 25 MG PO TABS: 25.0000 mg | ORAL_TABLET | Freq: Every day | ORAL | 2 refills | Status: DC

## 2022-07-24 MED ORDER — BUPROPION HCL ER (XL) 300 MG PO TB24: 300.0000 mg | ORAL_TABLET | Freq: Every day | ORAL | 2 refills | Status: DC

## 2022-07-24 MED ORDER — ARIPIPRAZOLE 2 MG PO TABS: 2.0000 mg | ORAL_TABLET | Freq: Every day | ORAL | 2 refills | Status: DC

## 2022-07-24 NOTE — Telephone Encounter (Signed)
Pt called stated she wants to talk to someone about her Botox. Pt is requesting a call back.

## 2022-07-24 NOTE — Telephone Encounter (Signed)
Pt called back. Stated she have another question. Pt is requesting a call back.

## 2022-07-24 NOTE — Telephone Encounter (Signed)
I have been contacting [facility] everyday for over a week and keep being told pt's insurance is "processing". Sent MyChart message to see if patient is agreeable to fill Botox at [facility].

## 2022-07-24 NOTE — Telephone Encounter (Signed)
Returned pt's call, we are going to see if [facility] is finished reviewing her insurance by tomorrow. If so, we will request overnight shipping so medication is here in time for 2/29 appointment.   Pt cannot fill with [facility] as her copay with them is over $400. Will follow up with [facility] tomorrow morning.

## 2022-07-24 NOTE — Telephone Encounter (Signed)
Called pt, she stated she asked [place] not to call back until after 10:30 am. This was not relayed to me, will call her back later.

## 2022-07-26 ENCOUNTER — Other Ambulatory Visit (HOSPITAL_COMMUNITY): Payer: Self-pay | Admitting: *Deleted

## 2022-07-26 ENCOUNTER — Ambulatory Visit: Payer: Medicare Other | Admitting: Psychiatry

## 2022-07-26 DIAGNOSIS — Z79899 Other long term (current) drug therapy: Secondary | ICD-10-CM

## 2022-07-26 DIAGNOSIS — G43719 Chronic migraine without aura, intractable, without status migrainosus: Secondary | ICD-10-CM | POA: Diagnosis not present

## 2022-07-26 MED ORDER — ONABOTULINUMTOXINA 200 UNITS IJ SOLR
155.0000 [IU] | Freq: Once | INTRAMUSCULAR | Status: AC
  Administered 2022-07-26: 155 [IU] via INTRAMUSCULAR

## 2022-07-26 MED ORDER — NARATRIPTAN HCL 2.5 MG PO TABS: 2.5000 mg | ORAL_TABLET | ORAL | 6 refills | Status: DC | PRN

## 2022-07-26 MED ORDER — QULIPTA 60 MG PO TABS: 60.0000 mg | ORAL_TABLET | Freq: Every day | ORAL | 6 refills | Status: DC

## 2022-07-26 NOTE — Progress Notes (Signed)
[facility]  BOTULINUM TOXIN INJECTION PROCEDURE NOTE  Patient: Hayley Bowman MRN: [id number]  Indication: Chronic migraines   History: 72 year old female with a history of PTSD, cervical stenosis s/p C4-7 ACDF who follows in clinic for chronic migraines. She stopped Topamax due to mood changes on the medication. Insurance wouldn't cover Qulipta or naratriptan, so she has just been taking Naproxen as needed. She is a month overdue for Botox today.  Last injection: 03/29/22  Technique: Informed consent was obtained and signed. 200 units onabotulinumtoxinA (Lot# [id number] ; Expiration: 10/2024) were reconstituted using normal saline, to a concentration of 5 units per 0.78m. 155 units were injected using sterile technique across 31 sites as follows: corrugator 10, procerus 5 units, frontalis 20 units, temporalis 40 units, occipitalis 30 units, cervical paraspinal 20 units, trapezius 30 units. 45 units were wasted. Patient tolerated procedure without complication.  Prior Therapies                                 Rescue: Advil Imitrex  Prevention: Topamax Lamictal Depakote Keppra Gabapentin Zoloft Cymbalta Nortriptyline - side effects Amitriptyline - side effects  Plan: -Prevention: Continue Botox. Will re-attempt to start Qulipta 60 mg daily as she is now under a new insurance -Rescue: Continue naproxen as needed. Will re-attempt to start naratriptan 2.5 mg PRN with her new insurance  JAnderson MaltaChima 07/26/22 1:12 PM

## 2022-07-26 NOTE — Progress Notes (Signed)
Botox- 200 units x 1 vial Lot: [id number] Expiration: 10/2024 NDC: [id number]  Bacteriostatic 0.9% Sodium Chloride- 20m total Lot: [id number]: 11/25 NDC: [id number] Dx: [id number]  Khadijah S. witnessed the above.

## 2022-07-31 LAB — LIPID PANEL
Chol/HDL Ratio: 2.7 ratio (ref 0.0–4.4)
Cholesterol, Total: 233 mg/dL — ABNORMAL HIGH (ref 100–199)
HDL: 87 mg/dL (ref >39)
LDL Chol Calc (NIH): 132 mg/dL — ABNORMAL HIGH (ref 0–99)
Triglycerides: 84 mg/dL (ref 0–149)
VLDL Cholesterol Cal: 14 mg/dL (ref 5–40)

## 2022-08-09 ENCOUNTER — Ambulatory Visit (INDEPENDENT_AMBULATORY_CARE_PROVIDER_SITE_OTHER): Payer: Medicare Other | Admitting: Clinical

## 2022-08-09 DIAGNOSIS — F411 Generalized anxiety disorder: Secondary | ICD-10-CM | POA: Diagnosis not present

## 2022-08-09 DIAGNOSIS — F331 Major depressive disorder, recurrent, moderate: Secondary | ICD-10-CM | POA: Diagnosis not present
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
## 2022-08-23 ENCOUNTER — Encounter (HOSPITAL_COMMUNITY): Payer: Self-pay | Admitting: Clinical

## 2022-08-23 ENCOUNTER — Ambulatory Visit (HOSPITAL_COMMUNITY): Payer: Medicare Other | Admitting: Clinical

## 2022-08-23 DIAGNOSIS — F331 Major depressive disorder, recurrent, moderate: Secondary | ICD-10-CM | POA: Diagnosis not present

## 2022-08-23 DIAGNOSIS — F411 Generalized anxiety disorder: Secondary | ICD-10-CM

## 2022-08-23 NOTE — Progress Notes (Signed)
THERAPIST PROGRESS NOTE  Session Time: 1:08pm-2:05pm  Session #3  Participation Level: Active  Behavioral Response: Meticulous and Well GroomedAlertEuthymic  Type of Therapy: Individual Therapy  Treatment Goals addressed:  LTG: Morenike will score less than 5 on the Generalized Anxiety Disorder 7 Scale (GAD-7)    STG: Bryonna will reduce frequency of avoidant behaviors by 50% as evidenced by self-report in therapy sessions   LTG: Reduce frequency, intensity, and duration of depression symptoms so that daily functioning is improved   STG: Reduce overall depression score to no higher than 9 on the Patient Health Questionnaire (PHQ-9)   ProgressTowards Goals: Progressing  Interventions: Solution Focused, Supportive, and Social Skills Training  Summary: Hayley Bowman is a 72 y.o. female who presents with unresolved grief issues and had deteriorated so badly between Christmas 2022 and Christmas 2023 that her family did an intervention and insisted that she get help.  She was in IOP and is now in individual therapy and medication management, has promised her family to continue these things to ensure her ongoing recovery.  Today she reports that she was able to remain on Abilify after her last visit, that a new prescription came through that her insurance honored.  She feels this medicine has returned her to being her normal self.  She has more energy and her motivation is greatly increased.  She appreciates things in her life and looks for the positive.  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  After the brain injury she became flat and unfeeling.  Now is the first time that she has returned to her original mood state and this makes her very happy and grateful.  We discussed her therapy schedule and agreed to have 2 more sessions every other week,  then move it to every 3-4 weeks.  She recapped the incident that she had reported last time, of spilling the paint, and talked about how everybody in the family from [place] great-niece to 57yo father came together to help.  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  She is doing things like helping with an Easter breakfast at church.  Throughout the session, she did fidget with her hair and philtrum quite a bit and shifted her sitting position many times.  This was noted but not brought to her attention, merely to perhaps notice in future sessions.  Patient spent time recounting some of the bad romantic relationships in her life, namely with ex-husband and ex-boyfriend.  She presented herself in a very positive light that was completely believably, as she could identify cognitive distortions used at the time by herself or by the partner and she used "I" statements throughout.  She is interested in perhaps dating again, does not really know where she would meet anyone so we discussed some possibilities.  Suicidal/Homicidal: No  Therapist Response: Patient is progressing in treatment and is remaining stable.  CSW provided mood monitoring and treatment progress review in the context of this episode of treatment.   Patient reported that her mood has been "good".   Patient was able to explore how this contrasts to the year before her eventual hospitalization recently.  CSW gave patient time to explore thoughts and feelings associated with life situations and external stressors.   CSW encouraged patient's expression of feelings and validated patient's thoughts, using empathic listening and unconditional positive regard.  Patient was oriented to time, place and situation.    Recommendations:  Return to therapy in 2 weeks, engage in self care behaviors, plan positive  social engagements  Plan: Return again in 2 weeks.  Diagnosis:  Moderate episode of recurrent major depressive disorder (HCC)  GAD (generalized anxiety disorder)  Collaboration of Care: Psychiatrist AEB - doctor can read therapy notes and vice versa  Patient/Guardian was advised Release of Information must be obtained prior to any record release in order to collaborate their care with an outside provider. Patient/Guardian was advised if they have not already done so to contact the registration department to sign all necessary forms in order for [place] to release information regarding their care.   Consent: Patient/Guardian gives verbal consent for treatment and assignment of benefits for services provided during this visit. Patient/Guardian expressed understanding and agreed to proceed.   Maretta Los, LCSW 08/23/2022

## 2022-09-06 ENCOUNTER — Encounter (HOSPITAL_COMMUNITY): Payer: Self-pay | Admitting: Clinical

## 2022-09-06 ENCOUNTER — Ambulatory Visit (HOSPITAL_COMMUNITY): Payer: Medicare Other | Admitting: Clinical

## 2022-09-06 DIAGNOSIS — F411 Generalized anxiety disorder: Secondary | ICD-10-CM

## 2022-09-06 DIAGNOSIS — F331 Major depressive disorder, recurrent, moderate: Secondary | ICD-10-CM | POA: Diagnosis not present

## 2022-09-06 NOTE — Progress Notes (Signed)
THERAPIST PROGRESS NOTE  Session Time: 1:00pm-2:00pm  Session #4  Participation Level: Active  Behavioral Response: Meticulous and Well Groomed Alert Euthymic and Tearful  Type of Therapy: Individual Therapy  Treatment Goals addressed:  LTG: Hayley Bowman will score less than 5 on the Generalized Anxiety Disorder 7 Scale (GAD-7)    STG: Hayley Bowman will reduce frequency of avoidant behaviors by 50% as evidenced by self-report in therapy sessions   LTG: Reduce frequency, intensity, and duration of depression symptoms so that daily functioning is improved   STG: Reduce overall depression score to no higher than 9 on the Patient Health Questionnaire (PHQ-9)   ProgressTowards Goals: Progressing  Interventions: Supportive and Other: Medication support  Summary: Hayley Bowman is a 72 y.o. female who presents with unresolved grief issues and had deteriorated so badly between Christmas 2022 and Christmas 2023 that her family did an intervention and insisted that she get help.  She was in IOP and is now in individual therapy and medication management, has promised her family to continue these things to ensure her ongoing recovery.  She reported that she is still not sleeping well, is waking up several times every night, had a nightmare recently that was disturbing.  She expressed concern about her medicine, as she is losing large amounts of hair to the extent that her hairdresser noticed this.  She has gained 8 pounds which she feels is a lot on her thin frame, and her appetite has increased.  She is more restless and less able to sit still.  CSW secure chatted the doctor to make him aware of these changes.  She stated she wants to stay stable so she does not want to switch the medicine, but she also cannot tolerate the side-effects if that is where these things are coming from.  Because of her saying at last session that she did not feel she has grieved her losses, CSW asked about what grieving them might look  like for her.  She said she did not know, she only knows that she kept herself busy and did not really cry enough or "get it out."  She stated that the birthday of her fiance, whom she lost in a car accident in early adulthood, was yesterday.  She spoke at length with his brother, with whom she stays in close contact.  He remarked about the fact that she was able to talk this time without breaking down.  He asked her for any pictures she had of her with her fiance, and she had to tell him that his mother had insisted at the time on having every picture of her son, so she had given them all to mother.  At this point with mother deceased, the photos are simply gone.  She stated she does not need the pictures because she has him fixed in her mind.  She reminisced a great deal about Hayley Bowman and about her high school days in [place].    Suicidal/Homicidal: No  Therapist Response: Patient is progressing in treatment and is remaining stable.  She feels she is "dealing" with life appropriately, was happy to report that she is going out of town to see her best friend/best friend's husband this weekend.  She is keeping a day planner in which she lists 3 things she is thankful for each day.  As she shares these with CSW, she is tearful.  CSW provided mood monitoring and treatment progress review in the context of this episode of treatment.  Patient reported that her mood has been "good".   Patient was able to explore how her medicine is affecting her body negatively.  CSW gave patient the opportunity to explore thoughts and feelings associated with current life situations and past/present external stressors as desired.   CSW encouraged patient's expression of feelings and validated patient's thoughts, using empathy, active listening, open body language, and unconditional positive regard.  Patient demonstrated an orientation to time, place, person and situation.      Recommendations:  Return to therapy in 2 weeks,  engage in self care behaviors, allow herself to cry in grief, talk with her psychiatrist about the side effects of her Abilify  Plan: Return again in 2 weeks.  Diagnosis:  GAD (generalized anxiety disorder)  Moderate episode of recurrent major depressive disorder  Collaboration of Care: Psychiatrist AEB - doctor can read therapy notes and vice versa; sent a secure chat to doctor about patient's apparent side-effects from Abilify  Patient/Guardian was advised Release of Information must be obtained prior to any record release in order to collaborate their care with an outside provider. Patient/Guardian was advised if they have not already done so to contact the registration department to sign all necessary forms in order for [place] to release information regarding their care.   Consent: Patient/Guardian gives verbal consent for treatment and assignment of benefits for services provided during this visit. Patient/Guardian expressed understanding and agreed to proceed.   Lynnell Chad, LCSW 09/06/2022

## 2022-09-24 ENCOUNTER — Ambulatory Visit (HOSPITAL_COMMUNITY): Payer: Medicare Other | Admitting: Clinical

## 2022-09-24 ENCOUNTER — Encounter (HOSPITAL_COMMUNITY): Payer: Self-pay | Admitting: Clinical

## 2022-09-24 DIAGNOSIS — F411 Generalized anxiety disorder: Secondary | ICD-10-CM

## 2022-09-24 DIAGNOSIS — F331 Major depressive disorder, recurrent, moderate: Secondary | ICD-10-CM

## 2022-09-24 NOTE — Progress Notes (Addendum)
THERAPIST PROGRESS NOTE  Session Time: 8:10-9:02am  Session #5  Virtual Visit via Video Note  I connected with Hayley Bowman on 09/24/22 at  8:00 AM EDT by a video enabled telemedicine application and verified that I am speaking with the correct person using two identifiers.  Location: Patient: home Provider: [facility] outpatient therapy office   I discussed the limitations of evaluation and management by telemedicine and the availability of in person appointments. The patient expressed understanding and agreed to proceed.   I discussed the assessment and treatment plan with the patient. The patient was provided an opportunity to ask questions and all were answered. The patient agreed with the plan and demonstrated an understanding of the instructions.   The patient was advised to call back or seek an in-person evaluation if the symptoms worsen or if the condition fails to improve as anticipated.  I provided 52 minutes of non-face-to-face time during this encounter.  Lynnell Chad, LCSW     Participation Level: Active  Behavioral Response: Casual Alert Anxious and Tearful  Type of Therapy: Individual Therapy  Treatment Goals addressed:  LTG: Hayley Bowman will score less than 5 on the Generalized Anxiety Disorder 7 Scale (GAD-7)    STG: Hayley Bowman will reduce frequency of avoidant behaviors by 50% as evidenced by self-report in therapy sessions   LTG: Reduce frequency, intensity, and duration of depression symptoms so that daily functioning is improved   STG: Reduce overall depression score to no higher than 9 on the Patient Health Questionnaire (PHQ-9)   ProgressTowards Goals: Progressing  Interventions: Supportive and Other: grief support    Summary: Hayley Bowman is a 72 y.o. female who presents with anxiety and depression for therapy after being in IOP.  She reported immediately that her 15yo dog Sophie died on 10/07/2022 and she has been hurting over this a lot.  She also had  surgery on her foot and is due to have the stitches out today.  She furthermore had an endoscopy in which they stretched her esophagus.  We talked about the pain one has to go through to come out the other side without pain.  Much of the session was spent in her reminiscing about Joretta Bachelor and telling about her illness and death.  This was the first dog she ever had and went with patient on most of her errands, seemed to understand when patient talked to her.  She was able to acknowledge her pain while also cherishing the memories and knowing the reason for the pain was the deep love that she and Sophie experienced.  The house feels empty now and she feels like she can relate to people in IOP who talked about being anxious when their loved ones go to work.  She took a lot of deep breaths and was encouraged to do this and other anxiety-calming technique such as grounding exercises.  She said her father asked how she was doing and she told him she was fine,but he told her that her affect was telling him she was sad.   She could see the benefit of telling him the truth of how sad she is, namely that family will know she is truthful about her feelings and also that she has people to talk to about her truth.  She was reminded that it was keeping her feelings to herself that led to her deterioration and the family's subsequent intervention last year.  She thought her next psychiatric appointment was 5/30, but it is actually  tomorrow.  CSW contacted front desk to ensure that this is to be done virtually, as she is still not getting around well due to her foot being in a boot.  Suicidal/Homicidal: No  Therapist Response: Patient is progressing in being able to express her feelings, acknowledge her pain, yet do so without feeling she cannot recover.  CSW provided mood monitoring and treatment progress review in the context of this episode of treatment.   Patient reported that her mood has been "more anxious recently".    Patient was able to explore how many large events happening at one time in her life can be overwhelming yet is quite normal.      CSW gave patient the opportunity to explore thoughts and feelings associated with current life situations and past/present external stressors as desired.   CSW encouraged patient's expression of feelings and validated patient's thoughts, using empathy, active listening, open body language, and unconditional positive regard.  Patient demonstrated an orientation to time, place, person and situation.       Recommendations:  Return to therapy in 2 weeks, continue to engage in self care behaviors, continue to allow herself to cry in grief, see psychiatrist tomorrow and talk about the side effects of her Abilify (hair loss) and whether there are other options  Plan: Return again in 2 weeks.  Diagnosis:  GAD (generalized anxiety disorder)  Moderate episode of recurrent major depressive disorder (HCC)  Collaboration of Care: Psychiatrist AEB - doctor can read therapy notes and vice versa; asked front desk to be sure appointment with psychiatrist tomorrow is virtual  Patient/Guardian was advised Release of Information must be obtained prior to any record release in order to collaborate their care with an outside provider. Patient/Guardian was advised if they have not already done so to contact the registration department to sign all necessary forms in order for [place] to release information regarding their care.   Consent: Patient/Guardian gives verbal consent for treatment and assignment of benefits for services provided during this visit. Patient/Guardian expressed understanding and agreed to proceed.   Lynnell Chad, LCSW 09/24/2022

## 2022-09-25 ENCOUNTER — Encounter (HOSPITAL_COMMUNITY): Payer: Self-pay | Admitting: Psychiatry

## 2022-09-25 ENCOUNTER — Telehealth (HOSPITAL_COMMUNITY): Payer: Medicare Other | Admitting: Psychiatry

## 2022-09-25 DIAGNOSIS — F411 Generalized anxiety disorder: Secondary | ICD-10-CM | POA: Diagnosis not present

## 2022-09-25 DIAGNOSIS — F338 Other recurrent depressive disorders: Secondary | ICD-10-CM

## 2022-09-25 DIAGNOSIS — F332 Major depressive disorder, recurrent severe without psychotic features: Secondary | ICD-10-CM | POA: Diagnosis not present

## 2022-09-25 MED ORDER — HYDROXYZINE HCL 25 MG PO TABS: 25.0000 mg | ORAL_TABLET | Freq: Every day | ORAL | 1 refills | Status: DC

## 2022-09-25 MED ORDER — ARIPIPRAZOLE 2 MG PO TABS: 1.0000 mg | ORAL_TABLET | Freq: Every day | ORAL | 2 refills | Status: DC

## 2022-09-25 MED ORDER — BUPROPION HCL ER (XL) 300 MG PO TB24: 300.0000 mg | ORAL_TABLET | Freq: Every day | ORAL | 2 refills | Status: DC

## 2022-09-25 MED ORDER — TRAZODONE HCL 50 MG PO TABS: 50.0000 mg | ORAL_TABLET | Freq: Every evening | ORAL | 2 refills | Status: DC | PRN

## 2022-09-25 NOTE — Progress Notes (Signed)
BH MD/PA/NP OP Progress Note  09/25/2022 2:58 PM Hayley Bowman  MRN:  [id number]  Visit Diagnosis:    ICD-10-CM   1. Severe episode of recurrent major depressive disorder, without psychotic features (HCC)  F33.2 ARIPiprazole (ABILIFY) 2 MG tablet    buPROPion (WELLBUTRIN XL) 300 MG 24 hr tablet    traZODone (DESYREL) 50 MG tablet    2. Seasonal affective disorder (HCC)  F33.8 ARIPiprazole (ABILIFY) 2 MG tablet    buPROPion (WELLBUTRIN XL) 300 MG 24 hr tablet    hydrOXYzine (ATARAX) 25 MG tablet    3. GAD (generalized anxiety disorder)  F41.1 ARIPiprazole (ABILIFY) 2 MG tablet    buPROPion (WELLBUTRIN XL) 300 MG 24 hr tablet    hydrOXYzine (ATARAX) 25 MG tablet      Assessment: Hayley Bowman is a 72 y.o. female with a history of depression, anxiety, seasonal affective disorder, PTSD, and postconcussive syndrome who presents virtually to [facility] at [facility] for initial evaluation on 07/24/2022 after completing the IOP program  At initial evaluation patient reports a history significant for generalized anxiety disorder and MDD which had improved following her completion of the IOP program.  During the peak of the MDD she endorsed symptoms of isolation, amotivation, anhedonia, fatigue, insomnia, and suicidality.  Patient also did endorse a significant past trauma history though denied symptoms consistent with PTSD.  Psychosocially patient has good supports in her family/sisters who she sees and talks to regularly.  At this time she meets criteria for MDD in partial remission and GAD.    Hayley Bowman presents for follow-up evaluation. Today, 09/25/22, patient reports continued improvement in her mood and anxiety symptoms despite a number of psychosocial stressors.  She has been active and increasing social in her day-to-day.  Patient has been experiencing hair loss which started a month ago.  This could be a potential side effect from the Abilify, Wellbutrin, or  trazodone.  This patient has been on the Wellbutrin for an extended period of time it is a bit less likely that it is the causative agent.  We will taper Abilify to 1 mg while monitoring mood and hair loss symptoms.  Then in 2 weeks we will have patient hold the trazodone and take Atarax instead while monitoring sleep and hair loss symptoms.  Patient will follow up in a month.  Plan: - Continue Wellbutrin XL 300 mg daily - Taper Abilify to 1 mg QD today - Change  Trazodone 50 mg QHS prn for insomnia - Increase Atarax to 25 mg BID prn for insomnia, start in 2 weeks - CMP, CBC, A1c, Vit D reviewed - Lipid profile ordered - Continue therapy with Ambrose Mantle - Crisis resources reviewed - Follow up in a month    Chief Complaint:  Chief Complaint  Patient presents with   Follow-up   HPI: Hayley Bowman presents reporting that things have gone fairly well for her in regards to her mood and anxiety symptoms.  She has been getting out of the house, spending time with people, and keeping busy during this time.  Patient notes that this is happening despite having a number of stressors in her life.  She has had a few medical procedures and her dog passed a few weeks ago.  Patient notes this is especially difficult for her as she been together with her dog Sophie for 15 years and the 2 did everything together.  Despite this she has been able to handle it fairly well compared  to how she would have in the past.  In regards to the medications patient has denied any significant side effects other than hair loss that started over the last month.  We reviewed the medication she is on and explained that hair loss could be a potential side effect from the antidepressant such as Wellbutrin and trazodone or from the Abilify.  Based on the fact that she has been on Wellbutrin for several months it seems a bit less likely for that to be the causative agent.  We discussed tapering the Abilify to 1 mg a day while  monitoring for any changes in mood or hair loss symptoms.  If no notable change in the next few weeks we suggested holding the trazodone and taking Atarax 25 mg at bedtime instead for insomnia symptoms.  Patient was in agreement with this plan and will reach out if she has any concerns prior to her next appointment.  Past Psychiatric History: Patient denies any prior psychiatric hospitalizations, suicide attempts, or self-injurious behaviors.  She was enrolled in the IOP program at [facility] in February 2024.  Has been on Wellbutrin, Trazodone, Cymbalta, nortriptyline, and Abilify  She denies any substance use  Past Medical History:  Past Medical History:  Diagnosis Date   Anxiety    History of traumatic head injury 2016   Kidney stones    PTSD (post-traumatic stress disorder)     Past Surgical History:  Procedure Laterality Date   AUGMENTATION MAMMAPLASTY     2015   LAMINECTOMY     REDUCTION MAMMAPLASTY     TONSILLECTOMY      Family History:  Family History  Problem Relation Age of Onset   Breast cancer Maternal Aunt    Breast cancer Paternal Grandmother     Social History:  Social History   Socioeconomic History   Marital status: Divorced    Spouse name: Not on file   Number of children: 0   Years of education: Not on file   Highest education level: Associate degree: occupational, Scientist, product/process development, or vocational program  Occupational History   Not on file  Tobacco Use   Smoking status: Never   Smokeless tobacco: Never  Vaping Use   Vaping Use: Never used  Substance and Sexual Activity   Alcohol use: Not Currently    Comment: rare   Drug use: Not Currently   Sexual activity: Not Currently  Other Topics Concern   Not on file  Social History Narrative   Not on file   Social Determinants of Health   Financial Resource Strain: Not on file  Food Insecurity: Not on file  Transportation Needs: Not on file  Physical Activity: Not on file  Stress: Not on file  Social  Connections: Not on file    Allergies:  Allergies  Allergen Reactions   Amoxicillin Nausea And Vomiting    Other reaction(s): UNKNOWN    Amoxicillin-Pot Clavulanate Nausea And Vomiting and Other (See Comments)   Codeine Anxiety, Nausea And Vomiting and Other (See Comments)    Agitation Other reaction(s): Other (See Comments) Agitation Agitation Gets mean, per patient.    Levetiracetam Nausea And Vomiting    Other reaction(s): Other "Wants to kill self".    Nortriptyline Hcl Other (See Comments)    Severe headache   Sulfa Antibiotics Nausea And Vomiting   Statins Other (See Comments)    Joint pain   Valproic Acid    Elemental Sulfur Nausea Only   Lasix [Furosemide] Rash    Current  Medications: Current Outpatient Medications  Medication Sig Dispense Refill   ARIPiprazole (ABILIFY) 2 MG tablet Take 0.5 tablets (1 mg total) by mouth daily. 15 tablet 2   botulinum toxin Type A (BOTOX) 200 units injection Inject155-200 Units into the muscle every 3 (three) months.  Inject into sites for migraine management 1 each 1   buPROPion (WELLBUTRIN XL) 300 MG 24 hr tablet Take 1 tablet (300 mg total) by mouth daily. 30 tablet 2   Cholecalciferol 75 MCG (3000 UT) TABS Take 1,000 Units by mouth daily.     Daily Multiple Vitamins tablet Take 1 tablet by mouth daily.     denosumab (PROLIA) 60 MG/ML SOSY injection Inject 60 mg into the skin every 6 (six) months.     hydrOXYzine (ATARAX) 25 MG tablet Take 1-2 tablets (25-50 mg total) by mouth at bedtime. 180 tablet 1   pantoprazole (PROTONIX) 20 MG tablet Take 20 mg by mouth daily as needed for heartburn or indigestion.     traZODone (DESYREL) 50 MG tablet Take 1 tablet (50 mg total) by mouth at bedtime as needed for sleep. 30 tablet 2   No current facility-administered medications for this visit.     Psychiatric Specialty Exam: Review of Systems  There were no vitals taken for this visit.There is no height or weight on file to calculate  BMI.  General Appearance: Well Groomed  Eye Contact:  Good  Speech:  Clear and Coherent and Normal Rate  Volume:  Normal  Mood:  Depressed, Euthymic, and grieving  Affect:  Appropriate and Congruent  Thought Process:  Coherent and Goal Directed  Orientation:  Full (Time, Place, and Person)  Thought Content: Logical   Suicidal Thoughts:  No  Homicidal Thoughts:  No  Memory:  Immediate;   Good  Judgement:  Good  Insight:  Fair  Psychomotor Activity:  Normal  Concentration:  Concentration: Good  Recall:  Good  Fund of Knowledge: Fair  Language: Good  Akathisia:  NA    AIMS (if indicated): not done  Assets:  Communication Skills Desire for Improvement Financial Resources/Insurance Housing Physical Health Transportation  ADL's:  Intact  Cognition: WNL  Sleep:  Good   Metabolic Disorder Labs: No results found for: "HGBA1C", "MPG" No results found for: "PROLACTIN" Lab Results  Component Value Date   CHOL 233 (H) 07/30/2022   TRIG 84 07/30/2022   HDL 87 07/30/2022   CHOLHDL 2.7 07/30/2022   LDLCALC 132 (H) 07/30/2022   No results found for: "TSH"  Therapeutic Level Labs: No results found for: "LITHIUM" No results found for: "VALPROATE" No results found for: "CBMZ"   Screenings: PHQ2-9    Flowsheet Row Counselor from 06/20/2022 in BEHAVIORAL HEALTH INTENSIVE PSYCH  PHQ-2 Total Score 5  PHQ-9 Total Score 21      Flowsheet Row Counselor from 06/20/2022 in BEHAVIORAL HEALTH INTENSIVE PSYCH ED from 06/15/2022 in [facility] ED from 01/08/2022 in [facility] Emergency Department at [facility]  C-SSRS RISK CATEGORY Error: Question 6 not populated No Risk No Risk       Collaboration of Care: Collaboration of Care: Medication Management AEB medication prescription and Referral or follow-up with counselor/therapist AEB chart review  Patient/Guardian was advised Release of Information must be obtained prior to any record release in  order to collaborate their care with an outside provider. Patient/Guardian was advised if they have not already done so to contact the registration department to sign all necessary forms in order for [place]  to release information regarding their care.   Consent: Patient/Guardian gives verbal consent for treatment and assignment of benefits for services provided during this visit. Patient/Guardian expressed understanding and agreed to proceed.    Stasia Cavalier, MD 09/25/2022, 2:58 PM   Virtual Visit via Video Note  I connected with Hayley Bowman on 09/25/22 at  2:00 PM EDT by a video enabled telemedicine application and verified that I am speaking with the correct person using two identifiers.  Location: Patient: Home Provider: Home Office   I discussed the limitations of evaluation and management by telemedicine and the availability of in person appointments. The patient expressed understanding and agreed to proceed.   I discussed the assessment and treatment plan with the patient. The patient was provided an opportunity to ask questions and all were answered. The patient agreed with the plan and demonstrated an understanding of the instructions.   The patient was advised to call back or seek an in-person evaluation if the symptoms worsen or if the condition fails to improve as anticipated.  I provided 30 minutes of non-face-to-face time during this encounter.   Stasia Cavalier, MD

## 2022-10-04 ENCOUNTER — Encounter (HOSPITAL_COMMUNITY): Payer: Self-pay | Admitting: Clinical

## 2022-10-04 ENCOUNTER — Ambulatory Visit (INDEPENDENT_AMBULATORY_CARE_PROVIDER_SITE_OTHER): Payer: Medicare Other | Admitting: Clinical

## 2022-10-04 DIAGNOSIS — F332 Major depressive disorder, recurrent severe without psychotic features: Secondary | ICD-10-CM | POA: Diagnosis not present

## 2022-10-04 DIAGNOSIS — F411 Generalized anxiety disorder: Secondary | ICD-10-CM | POA: Diagnosis not present

## 2022-10-04 NOTE — Progress Notes (Signed)
THERAPIST PROGRESS NOTE  Session Time: 9:02am-10:02am  Session #6  Virtual Visit via Video Note  I connected with Hayley Bowman on 10/04/22 at  9:00 AM EDT by a video enabled telemedicine application and verified that I am speaking with the correct person using two identifiers.  Location: Patient: home Provider: [facility] outpatient therapy office   I discussed the limitations of evaluation and management by telemedicine and the availability of in person appointments. The patient expressed understanding and agreed to proceed.   I discussed the assessment and treatment plan with the patient. The patient was provided an opportunity to ask questions and all were answered. The patient agreed with the plan and demonstrated an understanding of the instructions.   The patient was advised to call back or seek an in-person evaluation if the symptoms worsen or if the condition fails to improve as anticipated.  I provided 60 minutes of non-face-to-face time during this encounter.  Lynnell Chad, LCSW     Participation Level: Active  Behavioral Response: Casual Alert  Tearful    Type of Therapy: Individual Therapy  Treatment Goals addressed:  LTG: Hayley Bowman will score less than 5 on the Generalized Anxiety Disorder 7 Scale (GAD-7)    STG: Hayley Bowman will reduce frequency of avoidant behaviors by 50% as evidenced by self-report in therapy sessions   LTG: Reduce frequency, intensity, and duration of depression symptoms so that daily functioning is improved   STG: Reduce overall depression score to no higher than 9 on the Patient Health Questionnaire (PHQ-9)   ProgressTowards Goals: Progressing  Interventions: CBT, Supportive, and Other: grief support    Summary: Hayley Bowman is a 72 y.o. female who presents with anxiety and depression for therapy after being in IOP.  She is still wearing a boot on her foot and the stitches have not yet been taken out.  It is difficult for her to move  around in the community for very long, as wearing the boot during ambulation is very tiring.  Therefore she has not been going out as much as previously.  She has gained some weight from eating more and from better nutrition.  She pointed out that her face is quite swollen, which CSW had noticed both during this virtual session and during the last one.  She called herself "moon face" and said her family has noticed as well.  She said her Abilify dose was reduced at her last psychiatric visit, and could potentially be eliminated because of her hair loss which continues; however, her family begged her not to go off the medicine because of the difference they feel it has made in her mood.  She herself may not wish to go off the medicine, she stated, despite the side effects.  She spent some time talking about the way her life was before being on the current medicines and going through IOP then individual therapy, drawing comparisons with her current way of feeling/acting.  Since being in treatment, she has decluttered her home and this is very noticeable to friends and family.  Prior to this, she just left things laying around and building up.  She makes lists and this has helped her to stay focused on her recovery.    She remains in grief over the loss of her beloved dog Sophie.  She misses the schedule that was necessary to take care of an elderly dog, finds that it is harder to keep to a schedule without a specific reason it is needed.  We discussed the role of behavioral activation in the cognitive-behavioral model.  Even taking a shower with her foot wrapped up after surgery is such a chore that she dreads this task.  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  Suicidal/Homicidal: No  Therapist Response: Patient is progressing AEB her ongoing improved mood and ability to cope even when things are difficult in her life, as they  are right now for several reasons.  CSW provided mood monitoring and treatment progress review in the context of this episode of treatment.   Patient reported that her mood has been "good".   Patient was able to explore how even hair loss, weight gain, and a swollen face are not deterring her from taking her medicine.    CSW gave patient the opportunity to explore thoughts and feelings associated with current life situations and past/present external stressors as desired.   CSW encouraged patient's expression of feelings and validated patient's thoughts, using empathy, active listening, open body language, and unconditional positive regard.  Patient demonstrated an orientation to time, place, person and situation.         Recommendations:  Return to therapy in 2 weeks, continue to engage in self care behaviors, engage in behavioral activation  Plan: Return again in 2 weeks.  Diagnosis:  GAD (generalized anxiety disorder)  Severe episode of recurrent major depressive disorder, without psychotic features (HCC)  Collaboration of Care: Psychiatrist AEB - informed doctor of patient's concern about puffiness in face  Patient/Guardian was advised Release of Information must be obtained prior to any record release in order to collaborate their care with an outside provider. Patient/Guardian was advised if they have not already done so to contact the registration department to sign all necessary forms in order for [place] to release information regarding their care.   Consent: Patient/Guardian gives verbal consent for treatment and assignment of benefits for services provided during this visit. Patient/Guardian expressed understanding and agreed to proceed.   Lynnell Chad, LCSW 10/04/2022

## 2022-10-08 ENCOUNTER — Telehealth: Payer: Self-pay | Admitting: Psychiatry

## 2022-10-08 NOTE — Telephone Encounter (Signed)
Delivery pending pt consent. 

## 2022-10-10 NOTE — Telephone Encounter (Signed)
Called pt, informed her that consent was needed. She states she will be reaching out to [facility].

## 2022-10-15 NOTE — Telephone Encounter (Signed)
[facility] set delivery up for 10/16/22.

## 2022-10-18 NOTE — Progress Notes (Deleted)
10/18/22 ALL: She presents for Botox. She was last seen with Dr Delena Bali 06/2022. Naproxen continued as Qulipta and naratriptan not covered. Toprimate caused mood changes. Since,     Consent Form Botulism Toxin Injection For Chronic Migraine    Reviewed orally with patient, additionally signature is on file:  Botulism toxin has been approved by the Federal drug administration for treatment of chronic migraine. Botulism toxin does not cure chronic migraine and it may not be effective in some patients.  The administration of botulism toxin is accomplished by injecting a small amount of toxin into the muscles of the neck and head. Dosage must be titrated for each individual. Any benefits resulting from botulism toxin tend to wear off after 3 months with a repeat injection required if benefit is to be maintained. Injections are usually done every 3-4 months with maximum effect peak achieved by about 2 or 3 weeks. Botulism toxin is expensive and you should be sure of what costs you will incur resulting from the injection.  The side effects of botulism toxin use for chronic migraine may include:   -Transient, and usually mild, facial weakness with facial injections  -Transient, and usually mild, head or neck weakness with head/neck injections  -Reduction or loss of forehead facial animation due to forehead muscle weakness  -Eyelid drooping  -Dry eye  -Pain at the site of injection or bruising at the site of injection  -Double vision  -Potential unknown long term risks   Contraindications: You should not have Botox if you are pregnant, nursing, allergic to albumin, have an infection, skin condition, or muscle weakness at the site of the injection, or have myasthenia gravis, Lambert-Eaton syndrome, or ALS.  It is also possible that as with any injection, there may be an allergic reaction or no effect from the medication. Reduced effectiveness after repeated injections is sometimes seen and  rarely infection at the injection site may occur. All care will be taken to prevent these side effects. If therapy is given over a long time, atrophy and wasting in the muscle injected may occur. Occasionally the patient's become refractory to treatment because they develop antibodies to the toxin. In this event, therapy needs to be modified.  I have read the above information and consent to the administration of botulism toxin.    BOTOX PROCEDURE NOTE FOR MIGRAINE HEADACHE  Contraindications and precautions discussed with patient(above). Aseptic procedure was observed and patient tolerated procedure. Procedure performed by Shawnie Dapper, FNP-C.   The condition has existed for more than 6 months, and pt does not have a diagnosis of ALS, Myasthenia Gravis or Lambert-Eaton Syndrome.  Risks and benefits of injections discussed and pt agrees to proceed with the procedure.  Written consent obtained  These injections are medically necessary. Pt  receives good benefits from these injections. These injections do not cause sedations or hallucinations which the oral therapies may cause.   Description of procedure:  The patient was placed in a sitting position. The standard protocol was used for Botox as follows, with 5 units of Botox injected at each site:  -Procerus muscle, midline injection  -Corrugator muscle, bilateral injection  -Frontalis muscle, bilateral injection, with 2 sites each side, medial injection was performed in the upper one third of the frontalis muscle, in the region vertical from the medial inferior edge of the superior orbital rim. The lateral injection was again in the upper one third of the forehead vertically above the lateral limbus of the cornea, 1.5 cm  lateral to the medial injection site.  -Temporalis muscle injection, 4 sites, bilaterally. The first injection was 3 cm above the tragus of the ear, second injection site was 1.5 cm to 3 cm up from the first injection site in  line with the tragus of the ear. The third injection site was 1.5-3 cm forward between the first 2 injection sites. The fourth injection site was 1.5 cm posterior to the second injection site. 5th site laterally in the temporalis  muscleat the level of the outer canthus.  -Occipitalis muscle injection, 3 sites, bilaterally. The first injection was done one half way between the occipital protuberance and the tip of the mastoid process behind the ear. The second injection site was done lateral and superior to the first, 1 fingerbreadth from the first injection. The third injection site was 1 fingerbreadth superiorly and medially from the first injection site.  -Cervical paraspinal muscle injection, 2 sites, bilaterally. The first injection site was 1 cm from the midline of the cervical spine, 3 cm inferior to the lower border of the occipital protuberance. The second injection site was 1.5 cm superiorly and laterally to the first injection site.  -Trapezius muscle injection was performed at 3 sites, bilaterally. The first injection site was in the upper trapezius muscle halfway between the inflection point of the neck, and the acromion. The second injection site was one half way between the acromion and the first injection site. The third injection was done between the first injection site and the inflection point of the neck.   Will return for repeat injection in 3 months.   A total of 200 units of Botox was prepared, 155 units of Botox was injected as documented above, any Botox not injected was wasted. The patient tolerated the procedure well, there were no complications of the above procedure.

## 2022-10-24 ENCOUNTER — Ambulatory Visit: Payer: Medicare Other | Admitting: Family Medicine

## 2022-10-24 DIAGNOSIS — G43719 Chronic migraine without aura, intractable, without status migrainosus: Secondary | ICD-10-CM

## 2022-10-25 ENCOUNTER — Other Ambulatory Visit (HOSPITAL_COMMUNITY): Payer: Self-pay | Admitting: Psychiatry

## 2022-10-25 DIAGNOSIS — F338 Other recurrent depressive disorders: Secondary | ICD-10-CM

## 2022-10-25 DIAGNOSIS — F332 Major depressive disorder, recurrent severe without psychotic features: Secondary | ICD-10-CM

## 2022-10-25 DIAGNOSIS — F411 Generalized anxiety disorder: Secondary | ICD-10-CM

## 2022-10-31 ENCOUNTER — Encounter (HOSPITAL_COMMUNITY): Payer: Self-pay | Admitting: Psychiatry

## 2022-10-31 ENCOUNTER — Encounter (HOSPITAL_COMMUNITY): Payer: Self-pay | Admitting: Clinical

## 2022-10-31 ENCOUNTER — Telehealth (HOSPITAL_BASED_OUTPATIENT_CLINIC_OR_DEPARTMENT_OTHER): Payer: Medicare Other | Admitting: Psychiatry

## 2022-10-31 ENCOUNTER — Ambulatory Visit (INDEPENDENT_AMBULATORY_CARE_PROVIDER_SITE_OTHER): Payer: Medicare Other | Admitting: Clinical

## 2022-10-31 DIAGNOSIS — F332 Major depressive disorder, recurrent severe without psychotic features: Secondary | ICD-10-CM | POA: Diagnosis not present

## 2022-10-31 DIAGNOSIS — F338 Other recurrent depressive disorders: Secondary | ICD-10-CM

## 2022-10-31 DIAGNOSIS — F411 Generalized anxiety disorder: Secondary | ICD-10-CM

## 2022-10-31 MED ORDER — TRAZODONE HCL 50 MG PO TABS: 50.0000 mg | ORAL_TABLET | Freq: Every evening | ORAL | 2 refills | Status: DC | PRN
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
## 2022-10-31 NOTE — Progress Notes (Signed)
BH MD/PA/NP OP Progress Note  10/31/2022 12:58 PM Hayley Bowman  MRN:  [id number]  Visit Diagnosis:    ICD-10-CM   1. GAD (generalized anxiety disorder)  F41.1     2. Severe episode of recurrent major depressive disorder, without psychotic features (HCC)  F33.2 traZODone (DESYREL) 50 MG tablet    3. Seasonal affective disorder (HCC)  F33.8       Assessment: Hayley Bowman is a 72 y.o. female with a history of depression, anxiety, seasonal affective disorder, PTSD, and postconcussive syndrome who presents virtually to [facility] at [facility] for initial evaluation on 07/24/2022 after completing the IOP program  At initial evaluation patient Bowman a history significant for generalized anxiety disorder and MDD which had improved following her completion of the IOP program.  During the peak of the MDD she endorsed symptoms of isolation, amotivation, anhedonia, fatigue, insomnia, and passive suicidality.  Patient also did endorse a significant past trauma history though denied symptoms consistent with PTSD.  Psychosocially patient has good supports in her family/sisters who she sees and talks to regularly.  At this time she meets criteria for MDD in partial remission and GAD.    Hayley Bowman presents for follow-up evaluation. Today, 10/31/22, patient Bowman that mood symptoms have remained stable despite the decrease in Abilify.  She does still experience symptoms of hair loss.  We also looked into potential weight gain which patient had reported to her therapist in the interim.  On chart review patient weighs the same to 5 pounds less than she did in 2023.  While Abilify can cause increased appetite/weight gain that seems less likely at this time due to patient being at what appears to be her baseline from prior to starting Abilify.  We will continue to monitor weight symptoms and patient will also reach out to her PCP to explore if there are any other potential causes for her  cheeks becoming puffier.  Plan: - Continue Wellbutrin XL 300 mg daily - Continue Abilify 1 mg QD  - Continue Trazodone 50 mg QHS prn for insomnia - Change Atarax to 25 mg QHS prn for insomnia, start in 2 weeks - CMP, CBC, A1c, Vit D, lipid profile reviewed - Continue therapy with Ambrose Mantle - Crisis resources reviewed - Follow up in a month  Chief Complaint:  Chief Complaint  Patient presents with   Follow-up   HPI: Hayley Bowman presents reporting that she is doing "fine".  Since decreasing the Abilify her mood has Bowman fairly stable with maybe a slight decrease in perkiness.  She is still spending time with her family and going out and doing things with friends.  She denies any increased isolation or thoughts of self-harm/suicide.  Patient did not notice any improvement in the hair loss with the decrease in the Abilify.  She had also expressed concerns about weight gain which were explored. Hayley Bowman that she put on weight, about 10 pounds since starting Abilify, and furthermore believes she is still gaining weight. Notes that her cheeks are puffier. Hayley Bowman eating more sweets much more regularly and describes it almost as a craving, which is atypical for her.  We reviewed patient's former records dating back to 2018.  At that time patient has Bowman struggling with low weight weighing only 105 pounds.  At the encouragement of her provider she put on weight over the next several years and throughout 2023 she weighed between 145 and 150 pounds.  In January 2024  she also weighed 145 pounds.  Patient believes she had lost weight and was around 135 pounds a few months later and has since regained that weight now being around 145 pounds.  Based off patient's weight being in the appropriate range and with no significant increase from her baseline in 2023, there is decreased concern of this being secondary to Abilify.  Patient will need to monitor weight to see if it continues to increase.  She  will also reach out to her PCP to discuss this and review of their other potential causes.  Past Psychiatric History: Patient denies any prior psychiatric hospitalizations, suicide attempts, or self-injurious behaviors.  She was enrolled in the IOP program at [facility] in February 2024.  Has Bowman on Wellbutrin, Trazodone, Cymbalta, nortriptyline, and Abilify  She denies any substance use  Past Medical History:  Past Medical History:  Diagnosis Date   Anxiety    History of traumatic head injury 2016   Kidney stones    PTSD (post-traumatic stress disorder)     Past Surgical History:  Procedure Laterality Date   AUGMENTATION MAMMAPLASTY     2015   LAMINECTOMY     REDUCTION MAMMAPLASTY     TONSILLECTOMY      Family History:  Family History  Problem Relation Age of Onset   Breast cancer Maternal Aunt    Breast cancer Paternal Grandmother     Social History:  Social History   Socioeconomic History   Marital status: Divorced    Spouse name: Not on file   Number of children: 0   Years of education: Not on file   Highest education level: Associate degree: occupational, Scientist, product/process development, or vocational program  Occupational History   Not on file  Tobacco Use   Smoking status: Never   Smokeless tobacco: Never  Vaping Use   Vaping Use: Never used  Substance and Sexual Activity   Alcohol use: Not Currently    Comment: rare   Drug use: Not Currently   Sexual activity: Not Currently  Other Topics Concern   Not on file  Social History Narrative   Not on file   Social Determinants of Health   Financial Resource Strain: Not on file  Food Insecurity: Not on file  Transportation Needs: Not on file  Physical Activity: Not on file  Stress: Not on file  Social Connections: Not on file    Allergies:  Allergies  Allergen Reactions   Amoxicillin Nausea And Vomiting    Other reaction(s): UNKNOWN    Amoxicillin-Pot Clavulanate Nausea And Vomiting and Other (See Comments)   Codeine  Anxiety, Nausea And Vomiting and Other (See Comments)    Agitation Other reaction(s): Other (See Comments) Agitation Agitation Gets mean, per patient.    Levetiracetam Nausea And Vomiting    Other reaction(s): Other "Wants to kill self".    Nortriptyline Hcl Other (See Comments)    Severe headache   Sulfa Antibiotics Nausea And Vomiting   Statins Other (See Comments)    Joint pain   Valproic Acid    Elemental Sulfur Nausea Only   Lasix [Furosemide] Rash    Current Medications: Current Outpatient Medications  Medication Sig Dispense Refill   ARIPiprazole (ABILIFY) 2 MG tablet Take 0.5 tablets (1 mg total) by mouth daily. 15 tablet 2   botulinum toxin Type A (BOTOX) 200 units injection Inject155-200 Units into the muscle every 3 (three) months.  Inject into sites for migraine management 1 each 1   buPROPion (WELLBUTRIN XL) 300 MG  24 hr tablet TAKE 1 TABLET(300 MG) BY MOUTH DAILY 90 tablet 0   Cholecalciferol 75 MCG (3000 UT) TABS Take 1,000 Units by mouth daily.     Daily Multiple Vitamins tablet Take 1 tablet by mouth daily.     denosumab (PROLIA) 60 MG/ML SOSY injection Inject 60 mg into the skin every 6 (six) months.     hydrOXYzine (ATARAX) 25 MG tablet Take 1-2 tablets (25-50 mg total) by mouth at bedtime. 180 tablet 1   pantoprazole (PROTONIX) 20 MG tablet Take 20 mg by mouth daily as needed for heartburn or indigestion.     traZODone (DESYREL) 50 MG tablet Take 1 tablet (50 mg total) by mouth at bedtime as needed for sleep. 30 tablet 2   No current facility-administered medications for this visit.     Psychiatric Specialty Exam: Review of Systems  There were no vitals taken for this visit.There is no height or weight on file to calculate BMI.  General Appearance: Well Groomed  Eye Contact:  Good  Speech:  Clear and Coherent and Normal Rate  Volume:  Normal  Mood:  Anxious and Euthymic  Affect:  Appropriate and Congruent  Thought Process:  Coherent and Goal Directed   Orientation:  Full (Time, Place, and Person)  Thought Content: Logical   Suicidal Thoughts:  No  Homicidal Thoughts:  No  Memory:  Immediate;   Good  Judgement:  Good  Insight:  Fair  Psychomotor Activity:  Normal  Concentration:  Concentration: Good  Recall:  Good  Fund of Knowledge: Fair  Language: Good  Akathisia:  NA    AIMS (if indicated): not done  Assets:  Communication Skills Desire for Improvement Financial Resources/Insurance Housing Physical Health Transportation  ADL's:  Intact  Cognition: WNL  Sleep:  Good   Metabolic Disorder Labs: No results found for: "HGBA1C", "MPG" No results found for: "PROLACTIN" Lab Results  Component Value Date   CHOL 233 (H) 07/30/2022   TRIG 84 07/30/2022   HDL 87 07/30/2022   CHOLHDL 2.7 07/30/2022   LDLCALC 132 (H) 07/30/2022   No results found for: "TSH"  Therapeutic Level Labs: No results found for: "LITHIUM" No results found for: "VALPROATE" No results found for: "CBMZ"   Screenings: PHQ2-9    Flowsheet Row Counselor from 06/20/2022 in BEHAVIORAL HEALTH INTENSIVE PSYCH  PHQ-2 Total Score 5  PHQ-9 Total Score 21      Flowsheet Row Counselor from 06/20/2022 in BEHAVIORAL HEALTH INTENSIVE PSYCH ED from 06/15/2022 in [facility] ED from 01/08/2022 in [facility] Emergency Department at [facility]  C-SSRS RISK CATEGORY Error: Question 6 not populated No Risk No Risk       Collaboration of Care: Collaboration of Care: Medication Management AEB medication prescription and Referral or follow-up with counselor/therapist AEB chart review  Patient/Guardian was advised Release of Information must be obtained prior to any record release in order to collaborate their care with an outside provider. Patient/Guardian was advised if they have not already done so to contact the registration department to sign all necessary forms in order for [place] to release information regarding their care.    Consent: Patient/Guardian gives verbal consent for treatment and assignment of benefits for services provided during this visit. Patient/Guardian expressed understanding and agreed to proceed.    Stasia Cavalier, MD 10/31/2022, 12:58 PM   Virtual Visit via Video Note  I connected with Jeanmarie Hubert on 10/31/22 at 10:30 AM EDT by a video enabled telemedicine application  and verified that I am speaking with the correct person using two identifiers.  Location: Patient: Home Provider: Home Office   I discussed the limitations of evaluation and management by telemedicine and the availability of in person appointments. The patient expressed understanding and agreed to proceed.   I discussed the assessment and treatment plan with the patient. The patient was provided an opportunity to ask questions and all were answered. The patient agreed with the plan and demonstrated an understanding of the instructions.   The patient was advised to call back or seek an in-person evaluation if the symptoms worsen or if the condition fails to improve as anticipated.  I provided 25 minutes of non-face-to-face time during this encounter.   Stasia Cavalier, MD

## 2022-11-22 ENCOUNTER — Encounter (HOSPITAL_COMMUNITY): Payer: Self-pay | Admitting: Clinical

## 2022-11-22 ENCOUNTER — Ambulatory Visit (INDEPENDENT_AMBULATORY_CARE_PROVIDER_SITE_OTHER): Payer: Medicare Other | Admitting: Clinical

## 2022-11-22 DIAGNOSIS — F331 Major depressive disorder, recurrent, moderate: Secondary | ICD-10-CM

## 2022-11-22 NOTE — Progress Notes (Signed)
THERAPIST PROGRESS NOTE  Session Time: 11:15am-11:55am  Session #8  Virtual Visit via Video Note  I connected with Hayley Bowman on 11/22/22 at 11:00 AM EDT by a video enabled telemedicine application and verified that I am speaking with the correct person using two identifiers.  Location: Patient: home Provider: [facility] outpatient therapy office   I discussed the limitations of evaluation and management by telemedicine and the availability of in person appointments. The patient expressed understanding and agreed to proceed.   I discussed the assessment and treatment plan with the patient. The patient was provided an opportunity to ask questions and all were answered. The patient agreed with the plan and demonstrated an understanding of the instructions.   The patient was advised to call back or seek an in-person evaluation if the symptoms worsen or if the condition fails to improve as anticipated.  I provided 40 minutes of non-face-to-face time during this encounter.  Lynnell Chad, LCSW     Participation Level: Active  Behavioral Response: Casual Alert Euthymic and Tearful    Type of Therapy: Individual Therapy  Treatment Goals addressed:  LTG: Hayley Bowman will score less than 5 on the Generalized Anxiety Disorder 7 Scale (GAD-7)    STG: Hayley Bowman will reduce frequency of avoidant behaviors by 50% as evidenced by self-report in therapy sessions   LTG: Reduce frequency, intensity, and duration of depression symptoms so that daily functioning is improved   STG: Reduce overall depression score to no higher than 9 on the Patient Health Questionnaire (PHQ-9)   ProgressTowards Goals: Progressing  Interventions: Solution Focused and Supportive   Summary: Hayley Bowman is a 72 y.o. female who presents with anxiety and depression for therapy after being in IOP.  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  Her back has really been bothering her and she is convinced this is linked to her weight gain.  Much of the session was spent processing this and brainstorming solutions.  She has gained from about 135-140lb baseline to 149lb yesterday.  She has gone to the chiropractor 3 times which has helped.  She does not believe her weight gain is related to eating more, but later she does report that she has developed a "sweet tooth."    We also discussed shame she has felt at times, including when she was feeling poorly during the current renovation project and felt she was of no help to her sisters, thought about just leaving and going home.  We talked about where such shame comes from, how she handled that incident, and other ways she can do so if the same types of issues come up in the future.  Suicidal/Homicidal: No  Therapist Response: Patient is progressing AEB engaging in scheduled therapy session.  She presented oriented x5 and stated she was feeling "really good."  CSW evaluated patient's medication compliance and self-care since last session.  CSW reviewed the last session with patient who reported she has not yet had an appointment with her PCP that is needed..  Patient stated she is concerned about increased pain that she believes is resulting directly from her weight gain.  CSW encouraged patient to schedule more therapy sessions for the future, as her next appointment on 7/22 is the last scheduled.  Throughout the session, CSW gave patient the opportunity to explore thoughts and feelings associated with current life situations and past/present external  stressors.   CSW encouraged patient's expression of feelings and validated patient's thoughts using empathy, active listening, open body language, and unconditional positive regard.       Recommendations:  Return to therapy in 4 weeks, continue to engage in self care behaviors, see doctor about her  weight gain and possible sugar intake issues  Plan: Return again in 4 weeks.  Diagnosis:  Moderate episode of recurrent major depressive disorder (HCC)  Collaboration of Care: Psychiatrist AEB - therapist and doctor can read notes in Epic as needed  Patient/Guardian was advised Release of Information must be obtained prior to any record release in order to collaborate their care with an outside provider. Patient/Guardian was advised if they have not already done so to contact the registration department to sign all necessary forms in order for [place] to release information regarding their care.   Consent: Patient/Guardian gives verbal consent for treatment and assignment of benefits for services provided during this visit. Patient/Guardian expressed understanding and agreed to proceed.   Lynnell Chad, LCSW 11/22/2022

## 2022-11-30 ENCOUNTER — Telehealth (HOSPITAL_BASED_OUTPATIENT_CLINIC_OR_DEPARTMENT_OTHER): Payer: Medicare Other | Admitting: Psychiatry

## 2022-11-30 DIAGNOSIS — F411 Generalized anxiety disorder: Secondary | ICD-10-CM

## 2022-11-30 DIAGNOSIS — F332 Major depressive disorder, recurrent severe without psychotic features: Secondary | ICD-10-CM | POA: Diagnosis not present

## 2022-11-30 DIAGNOSIS — F338 Other recurrent depressive disorders: Secondary | ICD-10-CM | POA: Diagnosis not present

## 2022-11-30 MED ORDER — ARIPIPRAZOLE 2 MG PO TABS
1.0000 mg | ORAL_TABLET | Freq: Every day | ORAL | 0 refills | Status: DC
Start: 2022-11-30 — End: 2023-01-17

## 2022-11-30 MED ORDER — HYDROXYZINE HCL 25 MG PO TABS
25.0000 mg | ORAL_TABLET | Freq: Every day | ORAL | 1 refills | Status: DC
Start: 2022-11-30 — End: 2023-03-22

## 2022-11-30 NOTE — Progress Notes (Signed)
BH MD/PA/NP OP Progress Note  11/30/2022 8:30 AM Hayley Bowman  MRN:  [id number]  Visit Diagnosis:    ICD-10-CM   1. GAD (generalized anxiety disorder)  F41.1 ARIPiprazole (ABILIFY) 2 MG tablet    hydrOXYzine (ATARAX) 25 MG tablet    2. Severe episode of recurrent major depressive disorder, without psychotic features (HCC)  F33.2 ARIPiprazole (ABILIFY) 2 MG tablet    hydrOXYzine (ATARAX) 25 MG tablet    3. Seasonal affective disorder (HCC)  F33.8 ARIPiprazole (ABILIFY) 2 MG tablet    hydrOXYzine (ATARAX) 25 MG tablet       Assessment: Hayley Bowman is a 72 y.o. female with a history of depression, anxiety, seasonal affective disorder, PTSD, and postconcussive syndrome who presents virtually to [facility] at [facility] for initial evaluation on 07/24/2022 after completing the IOP program  At initial evaluation patient reports a history significant for generalized anxiety disorder and MDD which had improved following her completion of the IOP program.  During the peak of the MDD she endorsed symptoms of isolation, amotivation, anhedonia, fatigue, insomnia, and passive suicidality.  Patient also did endorse a significant past trauma history though denied symptoms consistent with PTSD.  Psychosocially patient has good supports in her family/sisters who she sees and talks to regularly.  At this time she meets criteria for MDD in partial remission and GAD.    Tim Lair Slaymaker presents for follow-up evaluation. Today, 11/30/22, patient reports that things have been stable over the past month. Her mood has been euthymic and she has remained active. Patient did have some increased difficulty with sleep and took her prn Atarax more frequently, however this was related to her traveling and being in different sleeping environments. She does still endorse concern about her weight gain, but thinks it is less likely to be related to the medication now. She mentioned that she noticed leg  swelling with pitting recently and we discussed how the medication would not lead to that. She has an appointment with her PCP on the 17th of this month. Will continue on her current regimen and follow up in 4-6 weeks.   Plan: - Continue Wellbutrin XL 300 mg daily - Continue Abilify 1 mg QD  - Continue Trazodone 50 mg QHS prn for insomnia - Continue Atarax 25-50 mg QHS prn for insomnia,  - CMP, CBC, A1c, Vit D, lipid profile reviewed - Continue therapy with Ambrose Mantle - Crisis resources reviewed - Follow up in a month  Chief Complaint:  Chief Complaint  Patient presents with   Follow-up   HPI: Hayley Bowman presents reporting that things are "not too bad". She had been keeping busy renovating a condo with her sisters and painting furniture, at a place her youngest sister got in [place]. She really enjoyed that but felt a bit sad when she came home as she had to leave her family there. She has been doing well at keeping herself busy. Did something with a friend for the holiday and has plans the next few weeks with her family. Her mood overall has been pretty good.Damitra notes that she has been using the atarax a bit more lately while she was traveling. This is due to her sleeping in places she is less comfortable in led her to using it more.   Her weight is still going up. She thinks that she is 149 pounds now. She has an appointment on the 17th. Angelise did notice that her legs have been swelling more recently, and  she has noticed she has a bit more of a sweet tooth lately. Overall though she does not feel she is eating more of very differently then the past.   Past Psychiatric History: Patient denies any prior psychiatric hospitalizations, suicide attempts, or self-injurious behaviors.  She was enrolled in the IOP program at [facility] in February 2024.  Has been on Wellbutrin, Trazodone, Cymbalta, nortriptyline, and Abilify  She denies any substance use  Past Medical History:  Past Medical  History:  Diagnosis Date   Anxiety    History of traumatic head injury 2016   Kidney stones    PTSD (post-traumatic stress disorder)     Past Surgical History:  Procedure Laterality Date   AUGMENTATION MAMMAPLASTY     2015   LAMINECTOMY     REDUCTION MAMMAPLASTY     TONSILLECTOMY      Family History:  Family History  Problem Relation Age of Onset   Breast cancer Maternal Aunt    Breast cancer Paternal Grandmother     Social History:  Social History   Socioeconomic History   Marital status: Divorced    Spouse name: Not on file   Number of children: 0   Years of education: Not on file   Highest education level: Associate degree: occupational, Scientist, product/process development, or vocational program  Occupational History   Not on file  Tobacco Use   Smoking status: Never   Smokeless tobacco: Never  Vaping Use   Vaping Use: Never used  Substance and Sexual Activity   Alcohol use: Not Currently    Comment: rare   Drug use: Not Currently   Sexual activity: Not Currently  Other Topics Concern   Not on file  Social History Narrative   Not on file   Social Determinants of Health   Financial Resource Strain: Not on file  Food Insecurity: Not on file  Transportation Needs: Not on file  Physical Activity: Not on file  Stress: Not on file  Social Connections: Not on file    Allergies:  Allergies  Allergen Reactions   Amoxicillin Nausea And Vomiting    Other reaction(s): UNKNOWN    Amoxicillin-Pot Clavulanate Nausea And Vomiting and Other (See Comments)   Codeine Anxiety, Nausea And Vomiting and Other (See Comments)    Agitation Other reaction(s): Other (See Comments) Agitation Agitation Gets mean, per patient.    Levetiracetam Nausea And Vomiting    Other reaction(s): Other "Wants to kill self".    Nortriptyline Hcl Other (See Comments)    Severe headache   Sulfa Antibiotics Nausea And Vomiting   Statins Other (See Comments)    Joint pain   Valproic Acid    Elemental  Sulfur Nausea Only   Lasix [Furosemide] Rash    Current Medications: Current Outpatient Medications  Medication Sig Dispense Refill   ARIPiprazole (ABILIFY) 2 MG tablet Take 0.5 tablets (1 mg total) by mouth daily. 45 tablet 0   botulinum toxin Type A (BOTOX) 200 units injection Inject155-200 Units into the muscle every 3 (three) months.  Inject into sites for migraine management 1 each 1   buPROPion (WELLBUTRIN XL) 300 MG 24 hr tablet TAKE 1 TABLET(300 MG) BY MOUTH DAILY 90 tablet 0   Cholecalciferol 75 MCG (3000 UT) TABS Take 1,000 Units by mouth daily.     Daily Multiple Vitamins tablet Take 1 tablet by mouth daily.     denosumab (PROLIA) 60 MG/ML SOSY injection Inject 60 mg into the skin every 6 (six) months.  hydrOXYzine (ATARAX) 25 MG tablet Take 1-2 tablets (25-50 mg total) by mouth at bedtime. 180 tablet 1   pantoprazole (PROTONIX) 20 MG tablet Take 20 mg by mouth daily as needed for heartburn or indigestion.     traZODone (DESYREL) 50 MG tablet Take 1 tablet (50 mg total) by mouth at bedtime as needed for sleep. 30 tablet 2   No current facility-administered medications for this visit.     Psychiatric Specialty Exam: Review of Systems  There were no vitals taken for this visit.There is no height or weight on file to calculate BMI.  General Appearance: Well Groomed  Eye Contact:  Good  Speech:  Clear and Coherent and Normal Rate  Volume:  Normal  Mood:  Euthymic  Affect:  Appropriate and Congruent  Thought Process:  Coherent and Goal Directed  Orientation:  Full (Time, Place, and Person)  Thought Content: Logical   Suicidal Thoughts:  No  Homicidal Thoughts:  No  Memory:  Immediate;   Good  Judgement:  Good  Insight:  Fair  Psychomotor Activity:  Normal  Concentration:  Concentration: Good  Recall:  Good  Fund of Knowledge: Fair  Language: Good  Akathisia:  NA    AIMS (if indicated): not done  Assets:  Communication Skills Desire for Improvement Financial  Resources/Insurance Housing Physical Health Transportation  ADL's:  Intact  Cognition: WNL  Sleep:  Good   Metabolic Disorder Labs: No results found for: "HGBA1C", "MPG" No results found for: "PROLACTIN" Lab Results  Component Value Date   CHOL 233 (H) 07/30/2022   TRIG 84 07/30/2022   HDL 87 07/30/2022   CHOLHDL 2.7 07/30/2022   LDLCALC 132 (H) 07/30/2022   No results found for: "TSH"  Therapeutic Level Labs: No results found for: "LITHIUM" No results found for: "VALPROATE" No results found for: "CBMZ"   Screenings: PHQ2-9    Flowsheet Row Counselor from 06/20/2022 in BEHAVIORAL HEALTH INTENSIVE PSYCH  PHQ-2 Total Score 5  PHQ-9 Total Score 21      Flowsheet Row Counselor from 06/20/2022 in BEHAVIORAL HEALTH INTENSIVE PSYCH ED from 06/15/2022 in [facility] ED from 01/08/2022 in [facility] Emergency Department at [facility]  C-SSRS RISK CATEGORY Error: Question 6 not populated No Risk No Risk       Collaboration of Care: Collaboration of Care: Medication Management AEB medication prescription and Referral or follow-up with counselor/therapist AEB chart review  Patient/Guardian was advised Release of Information must be obtained prior to any record release in order to collaborate their care with an outside provider. Patient/Guardian was advised if they have not already done so to contact the registration department to sign all necessary forms in order for [place] to release information regarding their care.   Consent: Patient/Guardian gives verbal consent for treatment and assignment of benefits for services provided during this visit. Patient/Guardian expressed understanding and agreed to proceed.    Stasia Cavalier, MD 11/30/2022, 8:30 AM   Virtual Visit via Video Note  I connected with Jeanmarie Hubert on 11/30/22 at  8:00 AM EDT by a video enabled telemedicine application and verified that I am speaking with the correct person using two  identifiers.  Location: Patient: Home Provider: Home Office   I discussed the limitations of evaluation and management by telemedicine and the availability of in person appointments. The patient expressed understanding and agreed to proceed.   I discussed the assessment and treatment plan with the patient. The patient was provided an opportunity to  ask questions and all were answered. The patient agreed with the plan and demonstrated an understanding of the instructions.   The patient was advised to call back or seek an in-person evaluation if the symptoms worsen or if the condition fails to improve as anticipated.  I provided 25 minutes of non-face-to-face time during this encounter.   Stasia Cavalier, MD

## 2022-12-17 ENCOUNTER — Ambulatory Visit (HOSPITAL_COMMUNITY): Payer: Medicare Other | Admitting: Clinical

## 2022-12-17 ENCOUNTER — Encounter (HOSPITAL_COMMUNITY): Payer: Self-pay | Admitting: Clinical

## 2022-12-17 DIAGNOSIS — F411 Generalized anxiety disorder: Secondary | ICD-10-CM

## 2022-12-17 DIAGNOSIS — F331 Major depressive disorder, recurrent, moderate: Secondary | ICD-10-CM | POA: Diagnosis not present

## 2022-12-17 NOTE — Progress Notes (Unsigned)
THERAPIST PROGRESS NOTE  Session Time: 9:09-10:02am  Session #9  Virtual Visit via Video Note  I connected with Hayley Bowman on 12/17/22 at  9:00 AM EDT by a video enabled telemedicine application and verified that I am speaking with the correct person using two identifiers.  Location: Patient: home Provider: [facility] outpatient therapy office   I discussed the limitations of evaluation and management by telemedicine and the availability of in person appointments. The patient expressed understanding and agreed to proceed.   I discussed the assessment and treatment plan with the patient. The patient was provided an opportunity to ask questions and all were answered. The patient agreed with the plan and demonstrated an understanding of the instructions.   The patient was advised to call back or seek an in-person evaluation if the symptoms worsen or if the condition fails to improve as anticipated.  I provided 53 minutes of non-face-to-face time during this encounter.  Lynnell Chad, LCSW    Participation Level: Active  Behavioral Response: Casual Alert Depressed and Tearful    Type of Therapy: Individual Therapy  Treatment Goals addressed:  LTG: Hayley Bowman will score less than 5 on the Generalized Anxiety Disorder 7 Scale (GAD-7)    STG: Hayley Bowman will reduce frequency of avoidant behaviors by 50% as evidenced by self-report in therapy sessions   LTG: Reduce frequency, intensity, and duration of depression symptoms so that daily functioning is improved   STG: Reduce overall depression score to no higher than 9 on the Patient Health Questionnaire (PHQ-9)   ProgressTowards Goals: Progressing  Interventions: CBT, Supportive, and Family Systems   Summary: Hayley Bowman is a 72 y.o. female who presents with anxiety and depression for therapy after being in IOP.  She was tearful and depressed throughout the entirety of the session.  It took awhile for her to share the reason  because of embarrassment.  She took her elderly parents to a reunion last week.  The rest of the weekend was spent by herself at home with not a lot to do, which she reported she "hates."  Every time she mentions her dog's name (Sophie), she breaks into tears.  We talked about her grief and how much she would talk to her dog, what a comfort and companion she was.  She is starting to consider getting another dog but she does not want to dishonor Sophie by doing so, as "nobody could replace her."  CSW reframed this for her.  We talked about behavioral activation as a means to change her mood as well.  She belongs to the [facility] and determined that she will make an appointment by the end of the week to be taken around that property and shown how to use things.  She also made a commitment to talk to the neighbor about perhaps taking walks with her.    The reason for her crying, in addition to the above, was eventually revealed to be that her sister excluded her from an event she had prepared for and anticipated going to, to the extent she had no other plans thus was left home the rest of the weekend alone with nothing to do.  She was initially embarrassed to discuss this, thinking that it sounded juvenile, but she was reassured by CSW that this sounded like a normal familial misunderstanding that could be worked on for the sake of future relationships.  Her sister had bought her a ticket for an Production assistant, radio they were going  to, but she gave that ticket away to someone else who showed up unexpectedly and did not tell the patient she was doing this.  Sister implied that perhaps the patient would not have been ready, but in fact she is always ready so this was an insult to her.  She talked about her feelings at length, was encouraged to tell sister the different ways in which this slight affected her.  She was encouraged to use "I" statements to better enable sister to see her viewpoint without herself becoming  argumentative.  The patient stopped crying and stated she felt better after talking about this for awhile.  Suicidal/Homicidal: No  Therapist Response: Patient is progressing AEB engaging in scheduled therapy session.  She presented oriented x5 and stated she was feeling "really sad."  CSW evaluated patient's medication compliance and self-care since last session.  Patient stated she is fine with her current medicines as long as she does not continue to gain weight.  The primary care physician has ruled out any medical causes for the weight gain.  CSW discussed coping skills, specifically behavioral activation and "I" statements.  Patient received these willingly and could discuss them in a way to indicate good comprehension.  CSW encouraged patient to schedule more therapy sessions for the future, as none are scheduled currently.  Throughout the session, CSW gave patient the opportunity to explore thoughts and feelings associated with current life situations and past/present external stressors.   CSW encouraged patient's expression of feelings and validated patient's thoughts using empathy, active listening, open body language, and unconditional positive regard.         Recommendations:  Return to therapy at first available appointment in appoximately 4 weeks, continue to engage in self care behaviors, speak to sister with "I" statements about issues addressed in session, consider strongly walking/gym for behavioral activation  Plan: Return again as soon as appointment is available  Diagnosis:  GAD (generalized anxiety disorder)  Moderate episode of recurrent major depressive disorder (HCC)  Collaboration of Care: Psychiatrist AEB - therapist and doctor can read notes in Epic as needed  Patient/Guardian was advised Release of Information must be obtained prior to any record release in order to collaborate their care with an outside provider. Patient/Guardian was advised if they have not already  done so to contact the registration department to sign all necessary forms in order for [place] to release information regarding their care.   Consent: Patient/Guardian gives verbal consent for treatment and assignment of benefits for services provided during this visit. Patient/Guardian expressed understanding and agreed to proceed.   Lynnell Chad, LCSW 12/17/2022

## 2023-01-02 ENCOUNTER — Ambulatory Visit: Payer: Medicare Other | Admitting: Family Medicine

## 2023-01-14 ENCOUNTER — Ambulatory Visit: Payer: Medicare Other | Admitting: Family Medicine

## 2023-01-15 NOTE — Progress Notes (Unsigned)
01/16/23 ALL: Hayley Bowman returns for Botox. Last procedure with Dr Delena Bali 06/2022. Appt scheduled 5/29 and 01/02/23 but cancelled. She reports doing well. She may have about 6 migraines per month. Naproxen will abort migraine. Baseline daily migraines. She works part tme as Buyer, retail.   07/26/2022 JC: 72 year old female with a history of PTSD, cervical stenosis s/p C4-7 ACDF who follows in clinic for chronic migraines. She stopped Topamax due to mood changes on the medication. Insurance wouldn't cover Qulipta or naratriptan, so she has just been taking Naproxen as needed. She is a month overdue for Botox today.   Last injection: 03/29/22    Consent Form Botulism Toxin Injection For Chronic Migraine    Reviewed orally with patient, additionally signature is on file:  Botulism toxin has been approved by the Federal drug administration for treatment of chronic migraine. Botulism toxin does not cure chronic migraine and it may not be effective in some patients.  The administration of botulism toxin is accomplished by injecting a small amount of toxin into the muscles of the neck and head. Dosage must be titrated for each individual. Any benefits resulting from botulism toxin tend to wear off after 3 months with a repeat injection required if benefit is to be maintained. Injections are usually done every 3-4 months with maximum effect peak achieved by about 2 or 3 weeks. Botulism toxin is expensive and you should be sure of what costs you will incur resulting from the injection.  The side effects of botulism toxin use for chronic migraine may include:   -Transient, and usually mild, facial weakness with facial injections  -Transient, and usually mild, head or neck weakness with head/neck injections  -Reduction or loss of forehead facial animation due to forehead muscle weakness  -Eyelid drooping  -Dry eye  -Pain at the site of injection or bruising at the site of injection  -Double  vision  -Potential unknown long term risks   Contraindications: You should not have Botox if you are pregnant, nursing, allergic to albumin, have an infection, skin condition, or muscle weakness at the site of the injection, or have myasthenia gravis, Lambert-Eaton syndrome, or ALS.  It is also possible that as with any injection, there may be an allergic reaction or no effect from the medication. Reduced effectiveness after repeated injections is sometimes seen and rarely infection at the injection site may occur. All care will be taken to prevent these side effects. If therapy is given over a long time, atrophy and wasting in the muscle injected may occur. Occasionally the patient's become refractory to treatment because they develop antibodies to the toxin. In this event, therapy needs to be modified.  I have read the above information and consent to the administration of botulism toxin.    BOTOX PROCEDURE NOTE FOR MIGRAINE HEADACHE  Contraindications and precautions discussed with patient(above). Aseptic procedure was observed and patient tolerated procedure. Procedure performed by Shawnie Dapper, FNP-C.   The condition has existed for more than 6 months, and pt does not have a diagnosis of ALS, Myasthenia Gravis or Lambert-Eaton Syndrome.  Risks and benefits of injections discussed and pt agrees to proceed with the procedure.  Written consent obtained  These injections are medically necessary. Pt  receives good benefits from these injections. These injections do not cause sedations or hallucinations which the oral therapies may cause.   Description of procedure:  The patient was placed in a sitting position. The standard protocol was used for Botox as  follows, with 5 units of Botox injected at each site:  -Procerus muscle, midline injection  -Corrugator muscle, bilateral injection  -Frontalis muscle, bilateral injection, with 2 sites each side, medial injection was performed in the upper  one third of the frontalis muscle, in the region vertical from the medial inferior edge of the superior orbital rim. The lateral injection was again in the upper one third of the forehead vertically above the lateral limbus of the cornea, 1.5 cm lateral to the medial injection site.  -Temporalis muscle injection, 4 sites, bilaterally. The first injection was 3 cm above the tragus of the ear, second injection site was 1.5 cm to 3 cm up from the first injection site in line with the tragus of the ear. The third injection site was 1.5-3 cm forward between the first 2 injection sites. The fourth injection site was 1.5 cm posterior to the second injection site. 5th site laterally in the temporalis  muscleat the level of the outer canthus.  -Occipitalis muscle injection, 3 sites, bilaterally. The first injection was done one half way between the occipital protuberance and the tip of the mastoid process behind the ear. The second injection site was done lateral and superior to the first, 1 fingerbreadth from the first injection. The third injection site was 1 fingerbreadth superiorly and medially from the first injection site.  -Cervical paraspinal muscle injection, 2 sites, bilaterally. The first injection site was 1 cm from the midline of the cervical spine, 3 cm inferior to the lower border of the occipital protuberance. The second injection site was 1.5 cm superiorly and laterally to the first injection site.  -Trapezius muscle injection was performed at 3 sites, bilaterally. The first injection site was in the upper trapezius muscle halfway between the inflection point of the neck, and the acromion. The second injection site was one half way between the acromion and the first injection site. The third injection was done between the first injection site and the inflection point of the neck.   Will return for repeat injection in 3 months.   A total of 200 units of Botox was prepared, 155 units of Botox was  injected as documented above, any Botox not injected was wasted. The patient tolerated the procedure well, there were no complications of the above procedure.

## 2023-01-16 ENCOUNTER — Ambulatory Visit: Payer: Medicare Other | Admitting: Family Medicine

## 2023-01-16 DIAGNOSIS — G43719 Chronic migraine without aura, intractable, without status migrainosus: Secondary | ICD-10-CM | POA: Diagnosis not present

## 2023-01-16 MED ORDER — ONABOTULINUMTOXINA 200 UNITS IJ SOLR
155.0000 [IU] | Freq: Once | INTRAMUSCULAR | Status: AC
Start: 2023-01-16 — End: 2023-01-16
  Administered 2023-01-16: 155 [IU] via INTRAMUSCULAR

## 2023-01-16 NOTE — Progress Notes (Unsigned)
BH MD/PA/NP OP Progress Note  01/17/2023 9:23 AM Hayley Bowman  MRN:  [id number]  Visit Diagnosis:    ICD-10-CM   1. Seasonal affective disorder (HCC)  F33.8 buPROPion (WELLBUTRIN XL) 300 MG 24 hr tablet    ARIPiprazole (ABILIFY) 2 MG tablet    2. GAD (generalized anxiety disorder)  F41.1 buPROPion (WELLBUTRIN XL) 300 MG 24 hr tablet    ARIPiprazole (ABILIFY) 2 MG tablet    3. Severe episode of recurrent major depressive disorder, without psychotic features (HCC)  F33.2 buPROPion (WELLBUTRIN XL) 300 MG 24 hr tablet    ARIPiprazole (ABILIFY) 2 MG tablet    traZODone (DESYREL) 50 MG tablet      Assessment: Hayley Bowman is a 72 y.o. female with a history of depression, anxiety, seasonal affective disorder, PTSD, and postconcussive syndrome who presents virtually to [facility] at [facility] for initial evaluation on 07/24/2022 after completing the IOP program  At initial evaluation patient reports a history significant for generalized anxiety disorder and MDD which had improved following her completion of the IOP program.  During the peak of the MDD she endorsed symptoms of isolation, amotivation, anhedonia, fatigue, insomnia, and passive suicidality.  Patient also did endorse a significant past trauma history though denied symptoms consistent with PTSD.  Psychosocially patient has good supports in her family/sisters who she sees and talks to regularly.  At this time she meets criteria for MDD in partial remission and GAD.    Tim Lair Buckholtz presents for follow-up evaluation. Today, 01/17/23, patient reports that her mood has continued to be stable over the past month.  There are some residual grief from the loss of her dog though it is within expected limits.  Patient continues medication consistently and denies adverse side effects.  She has focus more on her diet and has lost a few pounds in the last month.  Plan: - Continue Wellbutrin XL 300 mg daily - Continue Abilify  1 mg QD  - Continue Trazodone 50 mg QHS prn for insomnia - Continue Atarax 25-50 mg QHS prn for insomnia,  - CMP, CBC, A1c, Vit D, TSH, lipid profile reviewed - Continue therapy with Ambrose Mantle - Crisis resources reviewed - Follow up in 2 months  Chief Complaint:  Chief Complaint  Patient presents with   Follow-up   HPI: Hayley Bowman presents reporting that she is doing "fine". Things have been busy over the past month, in part having to take care of her parents. This can be a bit tiring at times and raise her anxiety especially with her father with him being harder on hearing and not always understanding what she is telling him. Thinks are going well with the rest of her family. Had been an issue with one of her sisters last month, but they have moved past it. She can still reach out to her sisters if anything is bothering her.   There is some residual grief from the loss of her dog of 15 years, but she is working through that.  Her mood has been good for the past month, and things have gone well with the medications. She still feels that the medicine works well. She has been working to control the weight gain, by focusing more on her eating and has lost a couple pounds. She is also looked into the hair loss and making some adjustments.  At this time patient is not concerned by these.  Past Psychiatric History: Patient denies any prior psychiatric hospitalizations, suicide  attempts, or self-injurious behaviors.  She was enrolled in the IOP program at [facility] in February 2024.  Has been on Wellbutrin, Trazodone, Cymbalta, nortriptyline, and Abilify  She denies any substance use  Past Medical History:  Past Medical History:  Diagnosis Date   Anxiety    History of traumatic head injury 2016   Kidney stones    PTSD (post-traumatic stress disorder)     Past Surgical History:  Procedure Laterality Date   AUGMENTATION MAMMAPLASTY     2015   LAMINECTOMY     REDUCTION MAMMAPLASTY      TONSILLECTOMY      Family History:  Family History  Problem Relation Age of Onset   Breast cancer Maternal Aunt    Breast cancer Paternal Grandmother     Social History:  Social History   Socioeconomic History   Marital status: Divorced    Spouse name: Not on file   Number of children: 0   Years of education: Not on file   Highest education level: Associate degree: occupational, Scientist, product/process development, or vocational program  Occupational History   Not on file  Tobacco Use   Smoking status: Never   Smokeless tobacco: Never  Vaping Use   Vaping status: Never Used  Substance and Sexual Activity   Alcohol use: Not Currently    Comment: rare   Drug use: Not Currently   Sexual activity: Not Currently  Other Topics Concern   Not on file  Social History Narrative   Not on file   Social Determinants of Health   Financial Resource Strain: Low Risk  (09/12/2022)   Received from [facility], [facility]   Overall Financial Resource Strain (CARDIA)    Difficulty of Paying Living Expenses: Not hard at all  Food Insecurity: No Food Insecurity (09/12/2022)   Received from [facility], [facility]   Hunger Vital Sign    Worried About Running Out of Food in the Last Year: Never true    Ran Out of Food in the Last Year: Never true  Transportation Needs: No Transportation Needs (09/12/2022)   Received from [employer], [facility]   PRAPARE - Transportation    Lack of Transportation (Medical): No    Lack of Transportation (Non-Medical): No  Physical Activity: Insufficiently Active (09/12/2022)   Received from [facility], [facility]   Exercise Vital Sign    Days of Exercise per Week: 3 days    Minutes of Exercise per Session: 30 min  Stress: No Stress Concern Present (09/12/2022)   Received from [facility], [facility] - Occupational Stress Questionnaire    Feeling of Stress : Only a little  Social Connections: Socially  Integrated (09/12/2022)   Received from [facility], [facility]   Social Network    How would you rate your social network (family, work, friends)?: Good participation with social networks    Allergies:  Allergies  Allergen Reactions   Amoxicillin Nausea And Vomiting    Other reaction(s): UNKNOWN    Amoxicillin-Pot Clavulanate Nausea And Vomiting and Other (See Comments)   Codeine Anxiety, Nausea And Vomiting and Other (See Comments)    Agitation Other reaction(s): Other (See Comments) Agitation Agitation Gets mean, per patient.    Levetiracetam Nausea And Vomiting    Other reaction(s): Other "Wants to kill self".    Nortriptyline Hcl Other (See Comments)    Severe headache   Sulfa Antibiotics Nausea And Vomiting   Statins Other (See Comments)  Joint pain   Valproic Acid    Elemental Sulfur Nausea Only   Lasix [Furosemide] Rash    Current Medications: Current Outpatient Medications  Medication Sig Dispense Refill   ARIPiprazole (ABILIFY) 2 MG tablet Take 0.5 tablets (1 mg total) by mouth daily. 45 tablet 0   botulinum toxin Type A (BOTOX) 200 units injection Inject155-200 Units into the muscle every 3 (three) months.  Inject into sites for migraine management 1 each 1   buPROPion (WELLBUTRIN XL) 300 MG 24 hr tablet Take 1 tablet (300 mg total) by mouth daily. 90 tablet 0   Daily Multiple Vitamins tablet Take 1 tablet by mouth daily.     denosumab (PROLIA) 60 MG/ML SOSY injection Inject 60 mg into the skin every 6 (six) months.     hydrOXYzine (ATARAX) 25 MG tablet Take 1-2 tablets (25-50 mg total) by mouth at bedtime. 180 tablet 1   pantoprazole (PROTONIX) 20 MG tablet Take 20 mg by mouth daily as needed for heartburn or indigestion.     traZODone (DESYREL) 50 MG tablet Take 1 tablet (50 mg total) by mouth at bedtime as needed for sleep. 30 tablet 2   No current facility-administered medications for this visit.     Psychiatric Specialty Exam: Review of Systems   There were no vitals taken for this visit.There is no height or weight on file to calculate BMI.  General Appearance: Well Groomed  Eye Contact:  Good  Speech:  Clear and Coherent and Normal Rate  Volume:  Normal  Mood:  Euthymic  Affect:  Appropriate and Congruent  Thought Process:  Coherent and Goal Directed  Orientation:  Full (Time, Place, and Person)  Thought Content: Logical   Suicidal Thoughts:  No  Homicidal Thoughts:  No  Memory:  Immediate;   Good  Judgement:  Good  Insight:  Fair  Psychomotor Activity:  Normal  Concentration:  Concentration: Good  Recall:  Good  Fund of Knowledge: Fair  Language: Good  Akathisia:  NA    AIMS (if indicated): not done  Assets:  Communication Skills Desire for Improvement Financial Resources/Insurance Housing Physical Health Transportation  ADL's:  Intact  Cognition: WNL  Sleep:  Good   Metabolic Disorder Labs: No results found for: "HGBA1C", "MPG" No results found for: "PROLACTIN" Lab Results  Component Value Date   CHOL 233 (H) 07/30/2022   TRIG 84 07/30/2022   HDL 87 07/30/2022   CHOLHDL 2.7 07/30/2022   LDLCALC 132 (H) 07/30/2022   No results found for: "TSH"  Therapeutic Level Labs: No results found for: "LITHIUM" No results found for: "VALPROATE" No results found for: "CBMZ"   Screenings: PHQ2-9    Flowsheet Row Counselor from 06/20/2022 in BEHAVIORAL HEALTH INTENSIVE PSYCH  PHQ-2 Total Score 5  PHQ-9 Total Score 21      Flowsheet Row Counselor from 06/20/2022 in BEHAVIORAL HEALTH INTENSIVE PSYCH ED from 06/15/2022 in [facility] ED from 01/08/2022 in [facility] Emergency Department at [facility]  C-SSRS RISK CATEGORY Error: Question 6 not populated No Risk No Risk       Collaboration of Care: Collaboration of Care: Medication Management AEB medication prescription, Primary Care Provider AEB chart review, and Referral or follow-up with counselor/therapist AEB chart  review  Patient/Guardian was advised Release of Information must be obtained prior to any record release in order to collaborate their care with an outside provider. Patient/Guardian was advised if they have not already done so to contact the registration department  to sign all necessary forms in order for [place] to release information regarding their care.   Consent: Patient/Guardian gives verbal consent for treatment and assignment of benefits for services provided during this visit. Patient/Guardian expressed understanding and agreed to proceed.    Stasia Cavalier, MD 01/17/2023, 9:23 AM   Virtual Visit via Video Note  I connected with Hayley Bowman on 01/17/23 at  9:00 AM EDT by a video enabled telemedicine application and verified that I am speaking with the correct person using two identifiers.  Location: Patient: Home Provider: Home Office   I discussed the limitations of evaluation and management by telemedicine and the availability of in person appointments. The patient expressed understanding and agreed to proceed.   I discussed the assessment and treatment plan with the patient. The patient was provided an opportunity to ask questions and all were answered. The patient agreed with the plan and demonstrated an understanding of the instructions.   The patient was advised to call back or seek an in-person evaluation if the symptoms worsen or if the condition fails to improve as anticipated.  I provided 25 minutes of non-face-to-face time during this encounter.   Stasia Cavalier, MD

## 2023-01-16 NOTE — Progress Notes (Signed)
Botox- 200 units x 1 vial Lot: [id number] Expiration: 03/2025 NDC: 8413-2440-10  Bacteriostatic 0.9% Sodium Chloride- * mL  Lot: [id number] Expiration: 08/27/2023 NDC: 6440-3474-25  Dx: Z56.387 S/P  Witnessed by Antionette Char

## 2023-01-17 ENCOUNTER — Telehealth (HOSPITAL_BASED_OUTPATIENT_CLINIC_OR_DEPARTMENT_OTHER): Payer: Medicare Other | Admitting: Psychiatry

## 2023-01-17 ENCOUNTER — Encounter (HOSPITAL_COMMUNITY): Payer: Self-pay | Admitting: Psychiatry

## 2023-01-17 DIAGNOSIS — F411 Generalized anxiety disorder: Secondary | ICD-10-CM | POA: Diagnosis not present

## 2023-01-17 DIAGNOSIS — F332 Major depressive disorder, recurrent severe without psychotic features: Secondary | ICD-10-CM

## 2023-01-17 DIAGNOSIS — F338 Other recurrent depressive disorders: Secondary | ICD-10-CM

## 2023-01-17 MED ORDER — ARIPIPRAZOLE 2 MG PO TABS
1.0000 mg | ORAL_TABLET | Freq: Every day | ORAL | 0 refills | Status: DC
Start: 2023-01-17 — End: 2023-01-31

## 2023-01-17 MED ORDER — BUPROPION HCL ER (XL) 300 MG PO TB24: 300.0000 mg | ORAL_TABLET | Freq: Every day | ORAL | 0 refills | Status: DC

## 2023-01-17 MED ORDER — TRAZODONE HCL 50 MG PO TABS
50.0000 mg | ORAL_TABLET | Freq: Every evening | ORAL | 2 refills | Status: DC | PRN
Start: 2023-01-17 — End: 2023-07-19

## 2023-01-30 ENCOUNTER — Other Ambulatory Visit (HOSPITAL_COMMUNITY): Payer: Self-pay | Admitting: Psychiatry

## 2023-01-30 DIAGNOSIS — F411 Generalized anxiety disorder: Secondary | ICD-10-CM

## 2023-01-30 DIAGNOSIS — F332 Major depressive disorder, recurrent severe without psychotic features: Secondary | ICD-10-CM

## 2023-01-30 DIAGNOSIS — F338 Other recurrent depressive disorders: Secondary | ICD-10-CM

## 2023-01-31 ENCOUNTER — Other Ambulatory Visit (HOSPITAL_COMMUNITY): Payer: Self-pay | Admitting: Psychiatry

## 2023-01-31 DIAGNOSIS — F332 Major depressive disorder, recurrent severe without psychotic features: Secondary | ICD-10-CM

## 2023-01-31 DIAGNOSIS — F338 Other recurrent depressive disorders: Secondary | ICD-10-CM

## 2023-01-31 DIAGNOSIS — F411 Generalized anxiety disorder: Secondary | ICD-10-CM

## 2023-01-31 MED ORDER — ARIPIPRAZOLE 2 MG PO TABS
2.0000 mg | ORAL_TABLET | Freq: Every day | ORAL | 0 refills | Status: DC
Start: 2023-01-31 — End: 2023-05-17

## 2023-01-31 NOTE — Telephone Encounter (Signed)
I did not send message. Came from patient.

## 2023-02-24 ENCOUNTER — Other Ambulatory Visit: Payer: Self-pay | Admitting: Psychiatry

## 2023-02-25 NOTE — Telephone Encounter (Signed)
Last seen on 01/16/23 Follow up scheduled on 04/10/23

## 2023-03-01 ENCOUNTER — Encounter (HOSPITAL_COMMUNITY): Payer: Self-pay | Admitting: Clinical

## 2023-03-01 ENCOUNTER — Ambulatory Visit (INDEPENDENT_AMBULATORY_CARE_PROVIDER_SITE_OTHER): Payer: Medicare Other | Admitting: Clinical

## 2023-03-01 DIAGNOSIS — F411 Generalized anxiety disorder: Secondary | ICD-10-CM

## 2023-03-01 DIAGNOSIS — F3341 Major depressive disorder, recurrent, in partial remission: Secondary | ICD-10-CM | POA: Diagnosis not present

## 2023-03-01 NOTE — Progress Notes (Signed)
THERAPIST PROGRESS NOTE  Session Time: 8:00-8:59am  Session #10  Virtual Visit via Video Note  I connected with Hayley Bowman on 03/01/23 at  8:00 AM EDT by a video enabled telemedicine application and verified that I am speaking with the correct person using two identifiers.  Location: Patient: home Provider: [facility] outpatient therapy office   I discussed the limitations of evaluation and management by telemedicine and the availability of in person appointments. The patient expressed understanding and agreed to proceed.   I discussed the assessment and treatment plan with the patient. The patient was provided an opportunity to ask questions and all were answered. The patient agreed with the plan and demonstrated an understanding of the instructions.   The patient was advised to call back or seek an in-person evaluation if the symptoms worsen or if the condition fails to improve as anticipated.  I provided 59 minutes of non-face-to-face time during this encounter.  Lynnell Chad, LCSW    Participation Level: Active  Behavioral Response: Casual Alert Euthymic   Type of Therapy: Individual Therapy  Treatment Goals addressed:  New Treatment Plan established:  Problem: Anxiety Goal: LTG: Rayhana will score less than 5 on the Generalized Anxiety Disorder 7 Scale (GAD-7)  (Resolved) Goal: STG: Xiara will reduce frequency of avoidant behaviors by 50% as evidenced by self-report in therapy sessions (Resolved) Goal: LTG: Maintain coping skills to work through anxiety as it arises as evidenced by self report that it is manageable. Goal: STG: Follow all established treatment plans with doctor and therapist for wellness recovery and maintenance. Intervention: Provide options for patient to work on grief, forgiveness, shame, sleep, relationship to food, or other issues as appropriate and as presents during sessions. Intervention: Work with Sheralyn Boatman to identify 3 personal goals for  managing their anxiety to work on during current treatment.  (Completed) Intervention: Work with Sheralyn Boatman to identify a minimum of 3 consequences of avoidance.  (Completed) Intervention: Work with Sheralyn Boatman to identify a minimum of 3 alternative coping behaviors to avoidance.  (Completed)   Problem: OP Depression Goal: LTG: Reduce frequency, intensity, and duration of depression symptoms so that daily functioning is improved 07/20/22 1501 [address] K[address] G Initial Goal: STG: Reduce overall depression score to no higher than 9 on the Patient Health Questionnaire (PHQ-9) (Resolved) Goal: LTG: Maintain her self-care and keep using appropriate coping skills (such as reframing thoughts, breathing techniques, grounding exercises, focusing on what she can control, etc.) in order to keep depression in remission as evidenced by PHQ-9 scores Goal: STG: Regain trust of family as evidenced by fewer inquiries about her mental health, whether she is taking her medicine, and checking on whether she is remaining in therapy. Goal: LTG: Continue to work through her grief over loss of her beloved dog and start to consider adopting a dog. Goal: LTG: Initiate more activities with her sisters and continue to leave the house/engage in behavioral activation, all while implementing appropriate boundaries for self and others. Intervention: Administer PHQ-9 at appropriate intervals and provide feedback to patient about progress. Intervention: Support patient with psychoeducation, processing, refocusing, positive feedback, and active listening to maintain her mental health recovery through coping skills, boundaries, communication, CBT, medication, nutrition, relationships, faith, grief issues. Intervention: Therapist will educate patient on cognitive distortions and the rationale for treatment of depression (Completed) Intervention: Alonnah will identify 3-5 cognitive distortions they are currently using and write reframing statements to replace  them (Completed) Intervention: Yuliet will review pleasant activities list and select 2 activities  to practice weekly for the next 26 weeks (Completed)  ProgressTowards Goals: Progressing  Interventions: Supportive and Other: progress review and treatment planning    Summary: Hayley Bowman is a 72 y.o. female who presents with anxiety and depression for therapy after being in IOP.  Patient reported on her continued progress with recovery.  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  She is dog-sitting every other week, has been on a beach vacation with her sisters, and has tolerated her mother being in the hospital twice without becoming overwhelmed.  She is starting to consider adopting a new dog but is still tearful in talking about her lost dog Sophie.  She is excited that she is growing new hair and no longer thinks the Abilify was causing her hair loss or weight gain.  She watches what she eats and has lost several pounds.  She talked once again about how the Abilify changed her mood within 2-3 days and she does not want to come off it.  CSW readministered the PHQ-9 and GAD-7, with results on both being "0".  This is a good indication of her remission from MDD and GAD, which will need to be monitored in order to ensure it remains this way.  We discussed whether she feels a need to continue in therapy, and she stated that her sisters will not allow her to leave therapy just yet.  She feels it is beneficial to remain focused on what she can control.  She was informed that a new treatment plan was needed and we decided on those new goals together.     03/01/2023    8:23 AM 06/20/2022   10:09 AM  Depression screen PHQ 2/9  Decreased Interest 0 3  Down, Depressed, Hopeless 0 2  PHQ - 2 Score 0 5  Altered sleeping 0 3  Tired, decreased energy 0 3  Change in appetite 0 3  Feeling bad or failure about yourself  0 3  Trouble concentrating  0 3  Moving slowly or fidgety/restless 0 1  Suicidal thoughts 0 0  PHQ-9 Score 0 21  Difficult doing work/chores  Extremely dIfficult       03/01/2023    8:28 AM  GAD 7 : Generalized Anxiety Score  Nervous, Anxious, on Edge 0  Control/stop worrying 0  Worry too much - different things 0  Trouble relaxing 0  Restless 0  Easily annoyed or irritable 0  Afraid - awful might happen 0  Total GAD 7 Score 0      Suicidal/Homicidal: No  Therapist Response: Patient is progressing AEB engaging in scheduled therapy session.  She presented oriented x5 and stated she was feeling "fine, no real downs."  CSW evaluated patient's medication compliance and self-care since last session.  We discussed whether she feels she needs to continue in therapy at this time since her anxiety and depression have gone into remission, and she is not comfortable with completely discontinuing because of her family's ongoing concern and the existence of more goals to accomplish.  It was agreed we will keep her appointments at monthly for now.  CSW encouraged patient to schedule more therapy sessions for the future, as there are none currently scheduled  Throughout the session, CSW gave patient the opportunity to explore thoughts and feelings associated with current life situations and past/present external stressors.   CSW encouraged patient's expression of feelings and validated patient's thoughts using empathy, active  listening, open body language, and unconditional positive regard.        Recommendations:  Return to therapy at first available appointment in appoximately 4 weeks, continue to engage in self care behaviors, continue with all her coping skills and self-care behaviors to keep her depression and anxiety in remission  Plan: Return again as soon as appointment is available  Diagnosis:  Recurrent major depressive disorder, in partial remission (HCC)  Generalized anxiety disorder  Collaboration of Care:  Psychiatrist AEB - therapist and doctor can read notes in Epic as needed  Patient/Guardian was advised Release of Information must be obtained prior to any record release in order to collaborate their care with an outside provider. Patient/Guardian was advised if they have not already done so to contact the registration department to sign all necessary forms in order for [place] to release information regarding their care.   Consent: Patient/Guardian gives verbal consent for treatment and assignment of benefits for services provided during this visit. Patient/Guardian expressed understanding and agreed to proceed.   Lynnell Chad, LCSW 03/01/2023

## 2023-03-07 ENCOUNTER — Other Ambulatory Visit (HOSPITAL_COMMUNITY): Payer: Self-pay | Admitting: Psychiatry

## 2023-03-07 DIAGNOSIS — F411 Generalized anxiety disorder: Secondary | ICD-10-CM

## 2023-03-07 DIAGNOSIS — F332 Major depressive disorder, recurrent severe without psychotic features: Secondary | ICD-10-CM

## 2023-03-07 DIAGNOSIS — F338 Other recurrent depressive disorders: Secondary | ICD-10-CM

## 2023-03-09 ENCOUNTER — Other Ambulatory Visit (HOSPITAL_COMMUNITY): Payer: Self-pay | Admitting: Psychiatry

## 2023-03-09 DIAGNOSIS — F338 Other recurrent depressive disorders: Secondary | ICD-10-CM

## 2023-03-09 DIAGNOSIS — F411 Generalized anxiety disorder: Secondary | ICD-10-CM

## 2023-03-09 DIAGNOSIS — F332 Major depressive disorder, recurrent severe without psychotic features: Secondary | ICD-10-CM

## 2023-03-21 NOTE — Progress Notes (Signed)
BH MD/PA/NP OP Progress Note  03/22/2023 9:03 AM EVANGALINE Bowman  MRN:  [id number]  Visit Diagnosis:    ICD-10-CM   1. GAD (generalized anxiety disorder)  F41.1 hydrOXYzine (ATARAX) 25 MG tablet    buPROPion (WELLBUTRIN XL) 150 MG 24 hr tablet    2. Seasonal affective disorder (HCC)  F33.8 hydrOXYzine (ATARAX) 25 MG tablet    buPROPion (WELLBUTRIN XL) 150 MG 24 hr tablet    3. Severe episode of recurrent major depressive disorder, without psychotic features (HCC)  F33.2 hydrOXYzine (ATARAX) 25 MG tablet    buPROPion (WELLBUTRIN XL) 150 MG 24 hr tablet       Assessment: Hayley Bowman is a 72 y.o. female with a history of depression, anxiety, seasonal affective disorder, PTSD, and postconcussive syndrome who presents virtually to [facility] at [facility] for initial evaluation on 07/24/2022 after completing the IOP program  At initial evaluation patient reports a history significant for generalized anxiety disorder and MDD which had improved following her completion of the IOP program.  During the peak of the MDD she endorsed symptoms of isolation, amotivation, anhedonia, fatigue, insomnia, and passive suicidality.  Patient also did endorse a significant past trauma history though denied symptoms consistent with PTSD.  Psychosocially patient has good supports in her family/sisters who she sees and talks to regularly.  At this time she meets criteria for MDD in partial remission and GAD.    Tim Lair Wiers presents for follow-up evaluation. Today, 03/22/23, patient reports that things have gone fairly well over the past 2 moths. She does still experience intermittent episodes of grief following the passing of her dog. Patient continues medication consistently and denies adverse side effects. The hair loss and weight gain she had reported previously has resolved. OF note due to a miscommunication patient had only been taking Wellbutrin XL 150 mg since her discharge from IOP. As  she has been stable we will continue on her current regimen.  Plan: - Decrease Wellbutrin XL to 150 mg daily - Continue Abilify 2 mg QD  - Continue Trazodone 50 mg QHS prn for insomnia - Continue Atarax 25-50 mg QHS prn for insomnia,  - CMP, CBC, A1c, Vit D, TSH, lipid profile reviewed - Continue therapy with Ambrose Mantle - Crisis resources reviewed - Follow up in 2 months  Chief Complaint:  Chief Complaint  Patient presents with   Follow-up   HPI: Hayley Bowman presents reporting that things have been going alright the past couple months. 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 She stays with the sister when she goes out there which she enjoys. Outside of this she has been keeping busy with her other events.  [name] still has some grief about the loss of her dog, triggered a bit by her niece's dog passing. Most days are good, but has some periods of low mood related to her grief. She is using her coping skills to get through these. The low mood is not progressing to the depression she had in the past.  The hair is coming back in, and she is losing weight so thinking that it has not been related to the medication now. Trazodone she is using sporadically, sleeps well when she uses it. On days she does not use she typically sleeps well, though can find it to be a bit harder at times.   Past Psychiatric History: Patient denies any prior psychiatric hospitalizations, suicide attempts, or self-injurious  behaviors.  She was enrolled in the IOP program at [facility] in February 2024.  Has been on Wellbutrin, Trazodone, Cymbalta, nortriptyline, and Abilify  She denies any substance use  Past Medical History:  Past Medical History:  Diagnosis Date   Anxiety    History of traumatic head injury 2016   Kidney stones    PTSD (post-traumatic stress disorder)     Past Surgical History:  Procedure Laterality Date   AUGMENTATION MAMMAPLASTY      2015   LAMINECTOMY     REDUCTION MAMMAPLASTY     TONSILLECTOMY      Family History:  Family History  Problem Relation Age of Onset   Breast cancer Maternal Aunt    Breast cancer Paternal Grandmother     Social History:  Social History   Socioeconomic History   Marital status: Divorced    Spouse name: Not on file   Number of children: 0   Years of education: Not on file   Highest education level: Associate degree: occupational, Scientist, product/process development, or vocational program  Occupational History   Not on file  Tobacco Use   Smoking status: Never   Smokeless tobacco: Never  Vaping Use   Vaping status: Never Used  Substance and Sexual Activity   Alcohol use: Not Currently    Comment: rare   Drug use: Not Currently   Sexual activity: Not Currently  Other Topics Concern   Not on file  Social History Narrative   Not on file   Social Determinants of Health   Financial Resource Strain: Low Risk  (09/12/2022)   Received from [facility], [facility]   Overall Financial Resource Strain (CARDIA)    Difficulty of Paying Living Expenses: Not hard at all  Food Insecurity: No Food Insecurity (09/12/2022)   Received from [facility], [facility]   Hunger Vital Sign    Worried About Running Out of Food in the Last Year: Never true    Ran Out of Food in the Last Year: Never true  Transportation Needs: No Transportation Needs (09/12/2022)   Received from [employer], [facility]   PRAPARE - Transportation    Lack of Transportation (Medical): No    Lack of Transportation (Non-Medical): No  Physical Activity: Insufficiently Active (09/12/2022)   Received from [facility], [facility]   Exercise Vital Sign    Days of Exercise per Week: 3 days    Minutes of Exercise per Session: 30 min  Stress: No Stress Concern Present (09/12/2022)   Received from [facility], [facility] - Occupational Stress Questionnaire    Feeling of Stress :  Only a little  Social Connections: Socially Integrated (09/12/2022)   Received from [facility], [facility]   Social Network    How would you rate your social network (family, work, friends)?: Good participation with social networks    Allergies:  Allergies  Allergen Reactions   Amoxicillin Nausea And Vomiting    Other reaction(s): UNKNOWN    Amoxicillin-Pot Clavulanate Nausea And Vomiting and Other (See Comments)   Codeine Anxiety, Nausea And Vomiting and Other (See Comments)    Agitation Other reaction(s): Other (See Comments) Agitation Agitation Gets mean, per patient.    Levetiracetam Nausea And Vomiting    Other reaction(s): Other "Wants to kill self".    Nortriptyline Hcl Other (See Comments)    Severe headache   Sulfa Antibiotics Nausea And Vomiting   Statins Other (See Comments)    Joint  pain   Valproic Acid    Elemental Sulfur Nausea Only   Lasix [Furosemide] Rash    Current Medications: Current Outpatient Medications  Medication Sig Dispense Refill   ARIPiprazole (ABILIFY) 2 MG tablet Take 1 tablet (2 mg total) by mouth daily. 90 tablet 0   BOTOX 200 units injection INJECT 200 UNITS INTO THE MUSCLES EVERY 3 MONTHS FOR MIGRAINE MANAGEMENT (MUST RECONSTITUTE) 1 each 1   buPROPion (WELLBUTRIN XL) 150 MG 24 hr tablet Take 1 tablet (150 mg total) by mouth daily. 90 tablet 0   Daily Multiple Vitamins tablet Take 1 tablet by mouth daily.     denosumab (PROLIA) 60 MG/ML SOSY injection Inject 60 mg into the skin every 6 (six) months.     hydrOXYzine (ATARAX) 25 MG tablet Take 1-2 tablets (25-50 mg total) by mouth at bedtime. 180 tablet 1   pantoprazole (PROTONIX) 20 MG tablet Take 20 mg by mouth daily as needed for heartburn or indigestion.     traZODone (DESYREL) 50 MG tablet Take 1 tablet (50 mg total) by mouth at bedtime as needed for sleep. 30 tablet 2   No current facility-administered medications for this visit.     Psychiatric Specialty Exam: Review of  Systems  There were no vitals taken for this visit.There is no height or weight on file to calculate BMI.  General Appearance: Well Groomed  Eye Contact:  Good  Speech:  Clear and Coherent and Normal Rate  Volume:  Normal  Mood:  Euthymic  Affect:  Appropriate, Congruent, and Tearful  Thought Process:  Coherent and Goal Directed  Orientation:  Full (Time, Place, and Person)  Thought Content: Logical   Suicidal Thoughts:  No  Homicidal Thoughts:  No  Memory:  Immediate;   Good  Judgement:  Good  Insight:  Fair  Psychomotor Activity:  Normal  Concentration:  Concentration: Good  Recall:  Good  Fund of Knowledge: Fair  Language: Good  Akathisia:  NA    AIMS (if indicated): not done  Assets:  Communication Skills Desire for Improvement Financial Resources/Insurance Housing Physical Health Transportation  ADL's:  Intact  Cognition: WNL  Sleep:  Good   Metabolic Disorder Labs: No results found for: "HGBA1C", "MPG" No results found for: "PROLACTIN" Lab Results  Component Value Date   CHOL 233 (H) 07/30/2022   TRIG 84 07/30/2022   HDL 87 07/30/2022   CHOLHDL 2.7 07/30/2022   LDLCALC 132 (H) 07/30/2022   No results found for: "TSH"  Therapeutic Level Labs: No results found for: "LITHIUM" No results found for: "VALPROATE" No results found for: "CBMZ"   Screenings: GAD-7    Flowsheet Row Counselor from 03/01/2023 in [facility] at [facility]  Total GAD-7 Score 0      PHQ2-9    Flowsheet Row Counselor from 03/01/2023 in [facility] at [facility] from 06/20/2022 in BEHAVIORAL HEALTH INTENSIVE PSYCH  PHQ-2 Total Score 0 5  PHQ-9 Total Score 0 21      Flowsheet Row Counselor from 06/20/2022 in BEHAVIORAL HEALTH INTENSIVE [facility] ED from 06/15/2022 in [facility] ED from 01/08/2022 in [facility] at [facility]  C-SSRS RISK CATEGORY Error: Question  6 not populated No Risk No Risk       Collaboration of Care: Collaboration of Care: Medication Management AEB medication prescription and Referral or follow-up with counselor/therapist AEB chart review  Patient/Guardian was advised Release of Information must be obtained prior to  any record release in order to collaborate their care with an outside provider. Patient/Guardian was advised if they have not already done so to contact the registration department to sign all necessary forms in order for [place] to release information regarding their care.   Consent: Patient/Guardian gives verbal consent for treatment and assignment of benefits for services provided during this visit. Patient/Guardian expressed understanding and agreed to proceed.    Stasia Cavalier, MD 03/22/2023, 9:03 AM   Virtual Visit via Video Note  I connected with Jeanmarie Hubert on 03/22/23 at  8:30 AM EDT by a video enabled telemedicine application and verified that I am speaking with the correct person using two identifiers.  Location: Patient: Home Provider: Home Office   I discussed the limitations of evaluation and management by telemedicine and the availability of in person appointments. The patient expressed understanding and agreed to proceed.   I discussed the assessment and treatment plan with the patient. The patient was provided an opportunity to ask questions and all were answered. The patient agreed with the plan and demonstrated an understanding of the instructions.   The patient was advised to call back or seek an in-person evaluation if the symptoms worsen or if the condition fails to improve as anticipated.  I provided 25 minutes of non-face-to-face time during this encounter.   Stasia Cavalier, MD

## 2023-03-22 ENCOUNTER — Telehealth (HOSPITAL_BASED_OUTPATIENT_CLINIC_OR_DEPARTMENT_OTHER): Payer: Medicare Other | Admitting: Psychiatry

## 2023-03-22 ENCOUNTER — Encounter (HOSPITAL_COMMUNITY): Payer: Self-pay | Admitting: Psychiatry

## 2023-03-22 DIAGNOSIS — F411 Generalized anxiety disorder: Secondary | ICD-10-CM

## 2023-03-22 DIAGNOSIS — F332 Major depressive disorder, recurrent severe without psychotic features: Secondary | ICD-10-CM | POA: Diagnosis not present

## 2023-03-22 DIAGNOSIS — F338 Other recurrent depressive disorders: Secondary | ICD-10-CM

## 2023-03-22 MED ORDER — BUPROPION HCL ER (XL) 150 MG PO TB24
150.0000 mg | ORAL_TABLET | Freq: Every day | ORAL | 0 refills | Status: DC
Start: 2023-03-22 — End: 2023-05-17

## 2023-03-22 MED ORDER — HYDROXYZINE HCL 25 MG PO TABS: 25.0000 mg | ORAL_TABLET | Freq: Every day | ORAL | 1 refills | Status: DC

## 2023-04-10 ENCOUNTER — Ambulatory Visit: Payer: Medicare Other | Admitting: Family Medicine

## 2023-04-16 NOTE — Progress Notes (Unsigned)
04/18/23 ALL: Elene returns for Botox. She continues to do well. She has about 5-6 headache days a month. She uses Aleve for abortive therapy as rx abortive meds have been too costly.   01/16/2023 ALL: April returns for Botox. Last procedure with Dr Delena Bali 06/2022. Appt scheduled 5/29 and 01/02/23 but cancelled. She reports doing well. She may have about 6 migraines per month. Naproxen will abort migraine. Baseline daily migraines. She works part tme as Buyer, retail.   07/26/2022 JC: 72 year old female with a history of PTSD, cervical stenosis s/p C4-7 ACDF who follows in clinic for chronic migraines. She stopped Topamax due to mood changes on the medication. Insurance wouldn't cover Qulipta or naratriptan, so she has just been taking Naproxen as needed. She is a month overdue for Botox today.   Last injection: 03/29/22    Consent Form Botulism Toxin Injection For Chronic Migraine    Reviewed orally with patient, additionally signature is on file:  Botulism toxin has been approved by the Federal drug administration for treatment of chronic migraine. Botulism toxin does not cure chronic migraine and it may not be effective in some patients.  The administration of botulism toxin is accomplished by injecting a small amount of toxin into the muscles of the neck and head. Dosage must be titrated for each individual. Any benefits resulting from botulism toxin tend to wear off after 3 months with a repeat injection required if benefit is to be maintained. Injections are usually done every 3-4 months with maximum effect peak achieved by about 2 or 3 weeks. Botulism toxin is expensive and you should be sure of what costs you will incur resulting from the injection.  The side effects of botulism toxin use for chronic migraine may include:   -Transient, and usually mild, facial weakness with facial injections  -Transient, and usually mild, head or neck weakness with head/neck  injections  -Reduction or loss of forehead facial animation due to forehead muscle weakness  -Eyelid drooping  -Dry eye  -Pain at the site of injection or bruising at the site of injection  -Double vision  -Potential unknown long term risks   Contraindications: You should not have Botox if you are pregnant, nursing, allergic to albumin, have an infection, skin condition, or muscle weakness at the site of the injection, or have myasthenia gravis, Lambert-Eaton syndrome, or ALS.  It is also possible that as with any injection, there may be an allergic reaction or no effect from the medication. Reduced effectiveness after repeated injections is sometimes seen and rarely infection at the injection site may occur. All care will be taken to prevent these side effects. If therapy is given over a long time, atrophy and wasting in the muscle injected may occur. Occasionally the patient's become refractory to treatment because they develop antibodies to the toxin. In this event, therapy needs to be modified.  I have read the above information and consent to the administration of botulism toxin.    BOTOX PROCEDURE NOTE FOR MIGRAINE HEADACHE  Contraindications and precautions discussed with patient(above). Aseptic procedure was observed and patient tolerated procedure. Procedure performed by Shawnie Dapper, FNP-C.   The condition has existed for more than 6 months, and pt does not have a diagnosis of ALS, Myasthenia Gravis or Lambert-Eaton Syndrome.  Risks and benefits of injections discussed and pt agrees to proceed with the procedure.  Written consent obtained  These injections are medically necessary. Pt  receives good benefits from these injections. These  injections do not cause sedations or hallucinations which the oral therapies may cause.   Description of procedure:  The patient was placed in a sitting position. The standard protocol was used for Botox as follows, with 5 units of Botox injected at  each site:  -Procerus muscle, midline injection  -Corrugator muscle, bilateral injection  -Frontalis muscle, bilateral injection, with 2 sites each side, medial injection was performed in the upper one third of the frontalis muscle, in the region vertical from the medial inferior edge of the superior orbital rim. The lateral injection was again in the upper one third of the forehead vertically above the lateral limbus of the cornea, 1.5 cm lateral to the medial injection site.  -Temporalis muscle injection, 4 sites, bilaterally. The first injection was 3 cm above the tragus of the ear, second injection site was 1.5 cm to 3 cm up from the first injection site in line with the tragus of the ear. The third injection site was 1.5-3 cm forward between the first 2 injection sites. The fourth injection site was 1.5 cm posterior to the second injection site. 5th site laterally in the temporalis  muscleat the level of the outer canthus.  -Occipitalis muscle injection, 3 sites, bilaterally. The first injection was done one half way between the occipital protuberance and the tip of the mastoid process behind the ear. The second injection site was done lateral and superior to the first, 1 fingerbreadth from the first injection. The third injection site was 1 fingerbreadth superiorly and medially from the first injection site.  -Cervical paraspinal muscle injection, 2 sites, bilaterally. The first injection site was 1 cm from the midline of the cervical spine, 3 cm inferior to the lower border of the occipital protuberance. The second injection site was 1.5 cm superiorly and laterally to the first injection site.  -Trapezius muscle injection was performed at 3 sites, bilaterally. The first injection site was in the upper trapezius muscle halfway between the inflection point of the neck, and the acromion. The second injection site was one half way between the acromion and the first injection site. The third  injection was done between the first injection site and the inflection point of the neck.   Will return for repeat injection in 3 months.   A total of 200 units of Botox was prepared, 155 units of Botox was injected as documented above, any Botox not injected was wasted. The patient tolerated the procedure well, there were no complications of the above procedure.

## 2023-04-18 ENCOUNTER — Ambulatory Visit: Payer: Medicare Other | Admitting: Family Medicine

## 2023-04-18 DIAGNOSIS — G43719 Chronic migraine without aura, intractable, without status migrainosus: Secondary | ICD-10-CM

## 2023-04-18 MED ORDER — ONABOTULINUMTOXINA 200 UNITS IJ SOLR
155.0000 [IU] | Freq: Once | INTRAMUSCULAR | Status: AC
Start: 2023-04-18 — End: 2023-04-18
  Administered 2023-04-18: 155 [IU] via INTRAMUSCULAR

## 2023-04-18 NOTE — Progress Notes (Signed)
Botox- 200 units x 1 vial Lot: [id number] Expiration: 07/2025 NDC: 9147-8295-62  Bacteriostatic 0.9% Sodium Chloride- * mL  Lot: [id number] Expiration: 08/27/2023 NDC: 7846-9629-52  Dx: W41.324 S/P Witnessed by Marcelina Morel, RN

## 2023-04-19 ENCOUNTER — Encounter (HOSPITAL_COMMUNITY): Payer: Self-pay | Admitting: Clinical

## 2023-04-19 ENCOUNTER — Ambulatory Visit (INDEPENDENT_AMBULATORY_CARE_PROVIDER_SITE_OTHER): Payer: Medicare Other | Admitting: Clinical

## 2023-04-19 DIAGNOSIS — F411 Generalized anxiety disorder: Secondary | ICD-10-CM

## 2023-04-19 DIAGNOSIS — F324 Major depressive disorder, single episode, in partial remission: Secondary | ICD-10-CM | POA: Diagnosis not present

## 2023-04-19 NOTE — Progress Notes (Signed)
THERAPIST PROGRESS NOTE  Session Time: 9:15am-10:00am  Session #11  Virtual Visit via Video Note  I connected with Heavenlee Gunnin Kafer on 04/19/23 at  9:00 AM EST by a video enabled telemedicine application and verified that I am speaking with the correct person using two identifiers.  Location: Patient: home Provider: [facility] outpatient therapy office   I discussed the limitations of evaluation and management by telemedicine and the availability of in person appointments. The patient expressed understanding and agreed to proceed.   I discussed the assessment and treatment plan with the patient. The patient was provided an opportunity to ask questions and all were answered. The patient agreed with the plan and demonstrated an understanding of the instructions.   The patient was advised to call back or seek an in-person evaluation if the symptoms worsen or if the condition fails to improve as anticipated.  I provided 45 minutes of non-face-to-face time during this encounter.  Lynnell Chad, LCSW    Participation Level: Active  Behavioral Response: Casual Alert Euthymic   Type of Therapy: Individual Therapy  Treatment Goals addressed:  Goal: LTG: Deltha will score less than 5 on the Generalized Anxiety Disorder 7 Scale (GAD-7)  (Resolved) Goal: STG: Mardella will reduce frequency of avoidant behaviors by 50% as evidenced by self-report in therapy sessions (Resolved) Goal: LTG: Maintain coping skills to work through anxiety as it arises as evidenced by self report that it is manageable. Goal: STG: Follow all established treatment plans with doctor and therapist for wellness recovery and maintenance. Goal: LTG: Reduce frequency, intensity, and duration of depression symptoms so that daily functioning is improved 07/20/22 1501 Iveth Heidemann G Initial Goal: STG: Reduce overall depression score to no higher than 9 on the Patient Health Questionnaire (PHQ-9) (Resolved) Goal: LTG: Maintain  her self-care and keep using appropriate coping skills (such as reframing thoughts, breathing techniques, grounding exercises, focusing on what she can control, etc.) in order to keep depression in remission as evidenced by PHQ-9 scores Goal: STG: Regain trust of family as evidenced by fewer inquiries about her mental health, whether she is taking her medicine, and checking on whether she is remaining in therapy. Goal: LTG: Continue to work through her grief over loss of her beloved dog and start to consider adopting a dog. Goal: LTG: Initiate more activities with her sisters and continue to leave the house/engage in behavioral activation, all while implementing appropriate boundaries for self and others.  ProgressTowards Goals: Progressing  Interventions: Supportive and Other: progress review and treatment planning    Summary: ANISTASIA SILVERA is a 72 y.o. female who presents with anxiety and depression for therapy after being in IOP.  She presented oriented x5 and stated she was feeling "good."  CSW evaluated patient's medication compliance, use of coping tools, and self-care, as applicable.   She is quite satisfied with where the medicines are now, and is happy to not have to take the higher dose of Wellbutrin which had been a mistake.  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  However, she has not yet been paid for any of that work or any of her expenses, so has told the owner that she will not work anymore unless she receives payment.  CSW gave her positive strokes for good boundaries.  She is no longer dog-sitting and does not miss it because she is staying busier with her sisters since they retired.  Much of the session was spent in her venting about how disappointed she is in her nephew who is on the opposite side of the rest of the family politically and has been behaving poorly since his candidate lost the election  recently.  CSW talked with her about using the Serenity Prayer to focus her efforts on what she can control.  She felt it was cleansing to process it and is trying to let it go.   Suicidal/Homicidal: No  Therapist Response:  Patient is progressing AEB engaging in scheduled therapy session.  Throughout the session, CSW gave patient the opportunity to explore thoughts and feelings associated with current life situations and past/present stressors.   CSW challenged patient gently and appropriately to consider different ways of looking at reported issues. CSW encouraged patient's expression of feelings and validated these using empathy, active listening, open body language, and unconditional positive regard.   CSW encouraged patient to schedule more therapy sessions for the future, as needed.      Recommendations:  Return to therapy in 4 weeks, continue to engage in self care behaviors, continue with all her coping skills and self-care behaviors to keep her depression and anxiety in remission  Plan: Return again  in 4 weeks on 12/20  Diagnosis:  Major depressive disorder in partial remission, unspecified whether recurrent (HCC)  GAD (generalized anxiety disorder)  Collaboration of Care: Psychiatrist AEB - psychiatrist can read therapy notes; therapist can and does read psychiatric notes prior to sessions   Patient/Guardian was advised Release of Information must be obtained prior to any record release in order to collaborate their care with an outside provider. Patient/Guardian was advised if they have not already done so to contact the registration department to sign all necessary forms in order for [place] to release information regarding their care.   Consent: Patient/Guardian gives verbal consent for treatment and assignment of benefits for services provided during this visit. Patient/Guardian expressed understanding and agreed to proceed.   Lynnell Chad, LCSW 04/19/2023

## 2023-05-13 NOTE — Progress Notes (Signed)
BH MD/PA/NP OP Progress Note  05/17/2023 8:44 AM Hayley Bowman  MRN:  [id number]  Visit Diagnosis:    ICD-10-CM   1. GAD (generalized anxiety disorder)  F41.1 ARIPiprazole (ABILIFY) 2 MG tablet    buPROPion (WELLBUTRIN XL) 150 MG 24 hr tablet    2. Seasonal affective disorder (HCC)  F33.8 ARIPiprazole (ABILIFY) 2 MG tablet    buPROPion (WELLBUTRIN XL) 150 MG 24 hr tablet    3. Major depressive disorder in partial remission, unspecified whether recurrent (HCC)  F32.4     4. Severe episode of recurrent major depressive disorder, without psychotic features (HCC)  F33.2 ARIPiprazole (ABILIFY) 2 MG tablet    buPROPion (WELLBUTRIN XL) 150 MG 24 hr tablet      Assessment: Hayley Bowman is a 72 y.o. female with a history of depression, anxiety, seasonal affective disorder, PTSD, and postconcussive syndrome who presents virtually to [facility] at [facility] for initial evaluation on 07/24/2022 after completing the IOP program  At initial evaluation patient reports a history significant for generalized anxiety disorder and MDD which had improved following her completion of the IOP program.  During the peak of the MDD she endorsed symptoms of isolation, amotivation, anhedonia, fatigue, insomnia, and passive suicidality.  Patient also did endorse a significant past trauma history though denied symptoms consistent with PTSD.  Psychosocially patient has good supports in her family/sisters who she sees and talks to regularly.  At this time she meets criteria for MDD in partial remission and GAD.    Hayley Bowman presents for follow-up evaluation. Today, 05/17/23, patient reports that her mood and anxiety have remained stable over the past 2 months.  She is getting by well despite the holidays typically being a more difficult time for her.  Patient attributes this in large part to the behavioral activation she has been engaging in.  She tolerated the decrease in Wellbutrin well though  had no reciprocal change in hair loss.  That said patient got a haircut which resolve her concerns about this.  We will continue on her current regimen and follow-up in 2 months.  Patient will continue therapy.  Plan: - Continue Wellbutrin XL 150 mg daily - Continue Abilify 2 mg QD  - Continue Trazodone 50 mg QHS prn for insomnia - Continue Atarax 25-50 mg QHS prn for insomnia,  - CMP, CBC, A1c, Vit D, TSH, lipid profile reviewed - Continue therapy with Ambrose Mantle - Crisis resources reviewed - Follow up in 2 months  Chief Complaint:  Chief Complaint  Patient presents with   Follow-up   HPI: Hayley Bowman presents reporting that she is doing good. She is planning to go spend it with her sisters which she is looking forward to.  Patient notes that she also had a good Thanksgiving.  Overall she has just been keeping busy visiting friends, family, and working a couple days a week.  With all this her mood has remained stable with no concerns of depression.  Hayley Bowman mentions that this is typically a harder time of year for her but this year she is not felt that at all.  She does have some residual grief from the loss of her dog Sophie that comes intermittently.  However this is getting less severe with no more episodes of crying.  Instead Hayley Bowman remembers the happy times they spend together.  She tolerated the decrease in Wellbutrin well with mood remaining stable.  The concern about hair loss has not really changed with the  taper.  Patient got a haircut to shoulder length and feels like this helped resolve her concerns about hair loss.  Appetite and weight remain stable.  Patient would like to continue on her current regimen at this time.  Past Psychiatric History: Patient denies any prior psychiatric hospitalizations, suicide attempts, or self-injurious behaviors.  She was enrolled in the IOP program at [facility] in February 2024.  Has been on Wellbutrin, Trazodone, Cymbalta, nortriptyline, and  Abilify  She denies any substance use  Past Medical History:  Past Medical History:  Diagnosis Date   Anxiety    History of traumatic head injury 2016   Kidney stones    PTSD (post-traumatic stress disorder)     Past Surgical History:  Procedure Laterality Date   AUGMENTATION MAMMAPLASTY     2015   LAMINECTOMY     REDUCTION MAMMAPLASTY     TONSILLECTOMY      Family History:  Family History  Problem Relation Age of Onset   Breast cancer Maternal Aunt    Breast cancer Paternal Grandmother     Social History:  Social History   Socioeconomic History   Marital status: Divorced    Spouse name: Not on file   Number of children: 0   Years of education: Not on file   Highest education level: Associate degree: occupational, Scientist, product/process development, or vocational program  Occupational History   Not on file  Tobacco Use   Smoking status: Never   Smokeless tobacco: Never  Vaping Use   Vaping status: Never Used  Substance and Sexual Activity   Alcohol use: Not Currently    Comment: rare   Drug use: Not Currently   Sexual activity: Not Currently  Other Topics Concern   Not on file  Social History Narrative   Not on file   Social Drivers of Health   Financial Resource Strain: Low Risk  (04/09/2023)   Received from [facility]   Overall Financial Resource Strain (CARDIA)    Difficulty of Paying Living Expenses: Not hard at all  Food Insecurity: No Food Insecurity (04/09/2023)   Received from [facility]   Hunger Vital Sign    Worried About Running Out of Food in the Last Year: Never true    Ran Out of Food in the Last Year: Never true  Transportation Needs: No Transportation Needs (04/09/2023)   Received from [facility] - Transportation    Lack of Transportation (Medical): No    Lack of Transportation (Non-Medical): No  Physical Activity: Insufficiently Active (04/09/2023)   Received from [facility]   Exercise Vital Sign    Days of Exercise per Week: 3  days    Minutes of Exercise per Session: 20 min  Stress: No Stress Concern Present (04/09/2023)   Received from [facility] - Occupational Stress Questionnaire    Feeling of Stress : Not at all  Social Connections: Socially Integrated (04/09/2023)   Received from [facility]   Social Network    How would you rate your social network (family, work, friends)?: Good participation with social networks    Allergies:  Allergies  Allergen Reactions   Amoxicillin Nausea And Vomiting    Other reaction(s): UNKNOWN    Amoxicillin-Pot Clavulanate Nausea And Vomiting and Other (See Comments)   Codeine Anxiety, Nausea And Vomiting and Other (See Comments)    Agitation Other reaction(s): Other (See Comments) Agitation Agitation Gets mean, per patient.    Levetiracetam Nausea And  Vomiting    Other reaction(s): Other "Wants to kill self".    Nortriptyline Hcl Other (See Comments)    Severe headache   Sulfa Antibiotics Nausea And Vomiting   Statins Other (See Comments)    Joint pain   Valproic Acid    Elemental Sulfur Nausea Only   Lasix [Furosemide] Rash    Current Medications: Current Outpatient Medications  Medication Sig Dispense Refill   ARIPiprazole (ABILIFY) 2 MG tablet Take 1 tablet (2 mg total) by mouth daily. 90 tablet 0   BOTOX 200 units injection INJECT 200 UNITS INTO THE MUSCLES EVERY 3 MONTHS FOR MIGRAINE MANAGEMENT (MUST RECONSTITUTE) 1 each 1   buPROPion (WELLBUTRIN XL) 150 MG 24 hr tablet Take 1 tablet (150 mg total) by mouth daily. 90 tablet 0   Daily Multiple Vitamins tablet Take 1 tablet by mouth daily.     denosumab (PROLIA) 60 MG/ML SOSY injection Inject 60 mg into the skin every 6 (six) months.     hydrOXYzine (ATARAX) 25 MG tablet Take 1-2 tablets (25-50 mg total) by mouth at bedtime. 180 tablet 1   pantoprazole (PROTONIX) 20 MG tablet Take 20 mg by mouth daily as needed for heartburn or indigestion.     traZODone  (DESYREL) 50 MG tablet Take 1 tablet (50 mg total) by mouth at bedtime as needed for sleep. 30 tablet 2   No current facility-administered medications for this visit.     Psychiatric Specialty Exam: Review of Systems  There were no vitals taken for this visit.There is no height or weight on file to calculate BMI.  General Appearance: Well Groomed  Eye Contact:  Good  Speech:  Clear and Coherent and Normal Rate  Volume:  Normal  Mood:  Euthymic  Affect:  Appropriate, Congruent, and Tearful  Thought Process:  Coherent and Goal Directed  Orientation:  Full (Time, Place, and Person)  Thought Content: Logical   Suicidal Thoughts:  No  Homicidal Thoughts:  No  Memory:  Immediate;   Good  Judgement:  Good  Insight:  Fair  Psychomotor Activity:  Normal  Concentration:  Concentration: Good  Recall:  Good  Fund of Knowledge: Fair  Language: Good  Akathisia:  NA    AIMS (if indicated): not done  Assets:  Communication Skills Desire for Improvement Financial Resources/Insurance Housing Physical Health Transportation  ADL's:  Intact  Cognition: WNL  Sleep:  Good   Metabolic Disorder Labs: No results found for: "HGBA1C", "MPG" No results found for: "PROLACTIN" Lab Results  Component Value Date   CHOL 233 (H) 07/30/2022   TRIG 84 07/30/2022   HDL 87 07/30/2022   CHOLHDL 2.7 07/30/2022   LDLCALC 132 (H) 07/30/2022   No results found for: "TSH"  Therapeutic Level Labs: No results found for: "LITHIUM" No results found for: "VALPROATE" No results found for: "CBMZ"   Screenings: GAD-7    Flowsheet Row Counselor from 03/01/2023 in [facility] at [facility]  Total GAD-7 Score 0      PHQ2-9    Flowsheet Row Counselor from 03/01/2023 in [facility] at [facility] from 06/20/2022 in BEHAVIORAL HEALTH INTENSIVE PSYCH  PHQ-2 Total Score 0 5  PHQ-9 Total Score 0 21      Flowsheet Row Counselor from  06/20/2022 in BEHAVIORAL HEALTH INTENSIVE [facility] ED from 06/15/2022 in [facility] ED from 01/08/2022 in [facility] Emergency Department at [facility]  C-SSRS RISK CATEGORY Error: Question 6 not populated No  Risk No Risk       Collaboration of Care: Collaboration of Care: Medication Management AEB medication prescription, Other provider involved in patient's care AEB neurology and PCP chart review, and Referral or follow-up with counselor/therapist AEB chart review  Patient/Guardian was advised Release of Information must be obtained prior to any record release in order to collaborate their care with an outside provider. Patient/Guardian was advised if they have not already done so to contact the registration department to sign all necessary forms in order for [place] to release information regarding their care.   Consent: Patient/Guardian gives verbal consent for treatment and assignment of benefits for services provided during this visit. Patient/Guardian expressed understanding and agreed to proceed.    Stasia Cavalier, MD 05/17/2023, 8:44 AM   Virtual Visit via Video Note  I connected with Hayley Bowman on 05/17/23 at  8:30 AM EST by a video enabled telemedicine application and verified that I am speaking with the correct person using two identifiers.  Location: Patient: Home Provider: Home Office   I discussed the limitations of evaluation and management by telemedicine and the availability of in person appointments. The patient expressed understanding and agreed to proceed.   I discussed the assessment and treatment plan with the patient. The patient was provided an opportunity to ask questions and all were answered. The patient agreed with the plan and demonstrated an understanding of the instructions.   The patient was advised to call back or seek an in-person evaluation if the symptoms worsen or if the condition fails to improve as anticipated.  I  provided 25 minutes of non-face-to-face time during this encounter.   Stasia Cavalier, MD

## 2023-05-17 ENCOUNTER — Encounter (HOSPITAL_COMMUNITY): Payer: Self-pay | Admitting: Clinical

## 2023-05-17 ENCOUNTER — Encounter (HOSPITAL_COMMUNITY): Payer: Self-pay | Admitting: Psychiatry

## 2023-05-17 ENCOUNTER — Ambulatory Visit (INDEPENDENT_AMBULATORY_CARE_PROVIDER_SITE_OTHER): Payer: Medicare Other | Admitting: Clinical

## 2023-05-17 ENCOUNTER — Telehealth (HOSPITAL_BASED_OUTPATIENT_CLINIC_OR_DEPARTMENT_OTHER): Payer: Medicare Other | Admitting: Psychiatry

## 2023-05-17 DIAGNOSIS — F338 Other recurrent depressive disorders: Secondary | ICD-10-CM

## 2023-05-17 DIAGNOSIS — F324 Major depressive disorder, single episode, in partial remission: Secondary | ICD-10-CM | POA: Diagnosis not present

## 2023-05-17 DIAGNOSIS — F411 Generalized anxiety disorder: Secondary | ICD-10-CM

## 2023-05-17 DIAGNOSIS — F332 Major depressive disorder, recurrent severe without psychotic features: Secondary | ICD-10-CM | POA: Diagnosis not present

## 2023-05-17 MED ORDER — BUPROPION HCL ER (XL) 150 MG PO TB24
150.0000 mg | ORAL_TABLET | Freq: Every day | ORAL | 0 refills | Status: DC
Start: 2023-05-17 — End: 2023-09-20

## 2023-05-17 MED ORDER — ARIPIPRAZOLE 2 MG PO TABS: 2.0000 mg | ORAL_TABLET | Freq: Every day | ORAL | 0 refills | Status: DC

## 2023-05-17 NOTE — Progress Notes (Signed)
THERAPIST PROGRESS NOTE  Session Time: 9:02am-9:23am  Session #12  Virtual Visit via Video Note  I connected with Susette Demedeiros Lebo on 05/17/23 at  9:00 AM EST by a video enabled telemedicine application and verified that I am speaking with the correct person using two identifiers.  Location: Patient: home Provider: [facility] outpatient therapy office   I discussed the limitations of evaluation and management by telemedicine and the availability of in person appointments. The patient expressed understanding and agreed to proceed.   I discussed the assessment and treatment plan with the patient. The patient was provided an opportunity to ask questions and all were answered. The patient agreed with the plan and demonstrated an understanding of the instructions.   The patient was advised to call back or seek an in-person evaluation if the symptoms worsen or if the condition fails to improve as anticipated.  I provided 21 minutes of non-face-to-face time during this encounter.  Lynnell Chad, LCSW    Participation Level: Active  Behavioral Response: Casual Alert Euthymic   Type of Therapy: Individual Therapy  Treatment Goals addressed:  Goal: LTG: Leonette will score less than 5 on the Generalized Anxiety Disorder 7 Scale (GAD-7)  (Resolved) Goal: STG: December will reduce frequency of avoidant behaviors by 50% as evidenced by self-report in therapy sessions (Resolved) Goal: LTG: Maintain coping skills to work through anxiety as it arises as evidenced by self report that it is manageable. Goal: STG: Follow all established treatment plans with doctor and therapist for wellness recovery and maintenance. Goal: LTG: Reduce frequency, intensity, and duration of depression symptoms so that daily functioning is improved Goal: STG: Reduce overall depression score to no higher than 9 on the Patient Health Questionnaire (PHQ-9) (Resolved) Goal: LTG: Maintain her self-care and keep using  appropriate coping skills (such as reframing thoughts, breathing techniques, grounding exercises, focusing on what she can control, etc.) in order to keep depression in remission as evidenced by PHQ-9 scores Goal: STG: Regain trust of family as evidenced by fewer inquiries about her mental health, whether she is taking her medicine, and checking on whether she is remaining in therapy. Goal: LTG: Continue to work through her grief over loss of her beloved dog and start to consider adopting a dog. Goal: LTG: Initiate more activities with her sisters and continue to leave the house/engage in behavioral activation, all while implementing appropriate boundaries for self and others.  ProgressTowards Goals: Progressing  Interventions: Supportive   Summary: FATOUMA SESSION is a 72 y.o. female who presents with anxiety and depression for therapy after being in IOP.  She presented oriented x5 and stated she was feeling "good, everything's good, no sadness."  CSW evaluated patient's medication compliance, use of coping tools, and self-care, as applicable.   We processed the changes in her mood from this time last year when she was so melancholy.  As we discussed this, CSW reminded her of the concerns of her family at that time.  She stated that now her family does not talk about her depression anymore at all, never brings it up or is insistent on her staying in treatment as they used to be.  They have developed a trust in her ability to care for herself and independently stay in treatment.  She stated she does not want to stop having therapy, but was amenable to CSW's suggestion that we switch to every other month at some point soon.  At patient's request, CSW requested that the front desk contact her  to schedule more appointments after the one already scheduled for next month.  The patient is working still and has solid plans for the holidays.  She mentioned her nephew but stated the family is no longer talking about  the issues he presents.  CSW asked if he could possibly be suffering depression and she stated he is in fact acting like she did when she was depressed.    Suicidal/Homicidal: No without intent/plan  Therapist Response:  Patient is progressing AEB engaging in scheduled therapy session.  Throughout the session, CSW gave patient the opportunity to explore thoughts and feelings associated with current life situations and past/present stressors.   CSW challenged patient gently and appropriately to consider different ways of looking at reported issues. CSW encouraged patient's expression of feelings and validated these using empathy, active listening, open body language, and unconditional positive regard.   CSW encouraged patient to schedule more therapy sessions for the future, as needed.      Recommendations:  Return to therapy in 4 weeks, continue to engage in self care behaviors, continue with all her coping skills and self-care behaviors to keep her depression and anxiety in remission  Plan: Return again  in 4 weeks on 1/17, reduce sessions to every other month after next session that is already scheduled  Diagnosis:  Major depressive disorder in partial remission, unspecified whether recurrent (HCC)  GAD (generalized anxiety disorder)  Collaboration of Care: Psychiatrist AEB - psychiatrist can read therapy notes; therapist can and does read psychiatric notes prior to sessions   Patient/Guardian was advised Release of Information must be obtained prior to any record release in order to collaborate their care with an outside provider. Patient/Guardian was advised if they have not already done so to contact the registration department to sign all necessary forms in order for [place] to release information regarding their care.   Consent: Patient/Guardian gives verbal consent for treatment and assignment of benefits for services provided during this visit. Patient/Guardian expressed understanding and  agreed to proceed.   Lynnell Chad, LCSW 05/17/2023

## 2023-06-14 ENCOUNTER — Ambulatory Visit (INDEPENDENT_AMBULATORY_CARE_PROVIDER_SITE_OTHER): Payer: Medicare Other | Admitting: Clinical

## 2023-06-14 ENCOUNTER — Encounter (HOSPITAL_COMMUNITY): Payer: Self-pay | Admitting: Clinical

## 2023-06-14 DIAGNOSIS — F411 Generalized anxiety disorder: Secondary | ICD-10-CM | POA: Diagnosis not present

## 2023-06-14 DIAGNOSIS — F324 Major depressive disorder, single episode, in partial remission: Secondary | ICD-10-CM

## 2023-06-14 NOTE — Progress Notes (Signed)
THERAPIST PROGRESS NOTE  Session Time: 9:03am-9:35am  Session #13  Virtual Visit via Video Note  I connected with Hayley Bowman on 06/14/23 at  9:00 AM EST by a video enabled telemedicine application and verified that I am speaking with the correct person using two identifiers.  Location: Patient: home Provider: [facility] outpatient therapy office   I discussed the limitations of evaluation and management by telemedicine and the availability of in person appointments. The patient expressed understanding and agreed to proceed.   I discussed the assessment and treatment plan with the patient. The patient was provided an opportunity to ask questions and all were answered. The patient agreed with the plan and demonstrated an understanding of the instructions.   The patient was advised to call back or seek an in-person evaluation if the symptoms worsen or if the condition fails to improve as anticipated.  I provided 32 minutes of non-face-to-face time during this encounter.  Lynnell Chad, LCSW    Participation Level: Active  Behavioral Response: Casual Alert Euthymic   Type of Therapy: Individual Therapy  Treatment Goals addressed:  Goal: LTG: Maintain coping skills to work through anxiety as it arises as evidenced by self report that it is manageable. Goal: STG: Follow all established treatment plans with doctor and therapist for wellness recovery and maintenance. Goal: LTG: Reduce frequency, intensity, and duration of depression symptoms so that daily functioning is improved Goal: LTG: Maintain her self-care and keep using appropriate coping skills (such as reframing thoughts, breathing techniques, grounding exercises, focusing on what she can control, etc.) in order to keep depression in remission as evidenced by PHQ-9 scores Goal: STG: Regain trust of family as evidenced by fewer inquiries about her mental health, whether she is taking her medicine, and checking on  whether she is remaining in therapy. Goal: LTG: Continue to work through her grief over loss of her beloved dog and start to consider adopting a dog. Goal: LTG: Initiate more activities with her sisters and continue to leave the house/engage in behavioral activation, all while implementing appropriate boundaries for self and others.  ProgressTowards Goals: Progressing  Interventions: Supportive and Other: treatment review and assessments    Summary: Hayley Bowman is a 73 y.o. female who presents with anxiety and depression for therapy after being in IOP.  She presented oriented x5 and stated she was feeling "good, nothing new going on."  CSW evaluated patient's medication compliance, use of coping tools, and self-care, as applicable.   She stated she continues in all her endeavors as before, is using her coping skills, taking her medicine, spending time with family, working, and such.  Her family complete trusts her now and no longer bothers her with phone calls checking on her constantly.  She also has reached out to a friend who is severely depressed and told of her own experience, was confident in being able to share with that individual.  We went through her treatment plan and evaluated her progress.  Items were either "progressing" or "completed/met."   PHQ-2 score today was 0 and GAD-7 score was 0 also.  She was informed that this time last year (on 06/20/22) her PHQ-9 score was 21, indicating severe depression.  She was astonished to think that she felt that bad previously and was grateful for the changes.  We discussed moving to every other month, then discontinuing therapy except as needed.    Suicidal/Homicidal: No without intent/plan  Therapist Response:  Patient is progressing AEB engaging in scheduled  therapy session.  Throughout the session, CSW gave patient the opportunity to explore thoughts and feelings associated with current life situations and past/present stressors.   CSW challenged  patient gently and appropriately to consider different ways of looking at reported issues. CSW encouraged patient's expression of feelings and validated these using empathy, active listening, open body language, and unconditional positive regard.   We are going to go to every other month for next appointment then reevaluate.      Recommendations:  Return to therapy in 4 weeks, continue to engage in self care behaviors, continue with all her coping skills and self-care behaviors to keep her depression and anxiety in remission  Plan: Return again  in 8 weeks on 3/20, reduce sessions to every other month after next session that is already scheduled  Diagnosis:  GAD (generalized anxiety disorder)  Major depressive disorder in partial remission, unspecified whether recurrent (HCC)  Collaboration of Care: Psychiatrist AEB - psychiatrist can read therapy notes; therapist can and does read psychiatric notes prior to sessions   Patient/Guardian was advised Release of Information must be obtained prior to any record release in order to collaborate their care with an outside provider. Patient/Guardian was advised if they have not already done so to contact the registration department to sign all necessary forms in order for [place] to release information regarding their care.   Consent: Patient/Guardian gives verbal consent for treatment and assignment of benefits for services provided during this visit. Patient/Guardian expressed understanding and agreed to proceed.      06/14/2023    9:15 AM 03/01/2023    8:23 AM 06/20/2022   10:09 AM  Depression screen PHQ 2/9  Decreased Interest 0 0 3  Down, Depressed, Hopeless 0 0 2  PHQ - 2 Score 0 0 5  Altered sleeping  0 3  Tired, decreased energy  0 3  Change in appetite  0 3  Feeling bad or failure about yourself   0 3  Trouble concentrating  0 3  Moving slowly or fidgety/restless  0 1  Suicidal thoughts  0 0  PHQ-9 Score  0 21  Difficult doing work/chores    Extremely dIfficult      06/14/2023    9:24 AM 03/01/2023    8:28 AM  GAD 7 : Generalized Anxiety Score  Nervous, Anxious, on Edge 0 0  Control/stop worrying 0 0  Worry too much - different things 0 0  Trouble relaxing 0 0  Restless 0 0  Easily annoyed or irritable 0 0  Afraid - awful might happen 0 0  Total GAD 7 Score 0 0  Anxiety Difficulty Not difficult at all       Lynnell Chad, LCSW 06/14/2023

## 2023-06-28 NOTE — Telephone Encounter (Signed)
auth#: [id number] (06/28/23-06/27/24

## 2023-06-28 NOTE — Telephone Encounter (Signed)
Previous Botox auth expired, submitted new request to [facility] via CMM. Status is pending, Key: [id number].

## 2023-07-15 NOTE — Telephone Encounter (Signed)
Called [employer] to ask about pt being covered under part B as buy/bill per pt's request, she has been under pharmacy benefit before. Spoke with Trey Paula, he states that no PA is required for buy/bill under medical benefits. Reference # for call is member's ID [id number] and today's date 07/15/2023.

## 2023-07-16 ENCOUNTER — Ambulatory Visit: Payer: Medicare Other | Admitting: Family Medicine

## 2023-07-17 NOTE — Progress Notes (Signed)
 07/22/23 ALL: Hayley Bowman returns for Botox. She continues to do well. Approx 4-5 headache days a month. Aleve works well.   04/18/2023 ALL: Hayley Bowman returns for Botox. She continues to do well. She has about 5-6 headache days a month. She uses Aleve for abortive therapy as rx abortive meds have been too costly.   01/16/2023 ALL: Hayley Bowman returns for Botox. Last procedure with Dr Delena Bali 06/2022. Appt scheduled 5/29 and 01/02/23 but cancelled. She reports doing well. She may have about 6 migraines per month. Naproxen will abort migraine. Baseline daily migraines. She works part tme as Buyer, retail.   07/26/2022 JC: 73 year old female with a history of PTSD, cervical stenosis s/p C4-7 ACDF who follows in clinic for chronic migraines. She stopped Topamax due to mood changes on the medication. Insurance wouldn't cover Qulipta or naratriptan, so she has just been taking Naproxen as needed. She is a month overdue for Botox today.   Last injection: 03/29/22    Consent Form Botulism Toxin Injection For Chronic Migraine    Reviewed orally with patient, additionally signature is on file:  Botulism toxin has been approved by the Federal drug administration for treatment of chronic migraine. Botulism toxin does not cure chronic migraine and it may not be effective in some patients.  The administration of botulism toxin is accomplished by injecting a small amount of toxin into the muscles of the neck and head. Dosage must be titrated for each individual. Any benefits resulting from botulism toxin tend to wear off after 3 months with a repeat injection required if benefit is to be maintained. Injections are usually done every 3-4 months with maximum effect peak achieved by about 2 or 3 weeks. Botulism toxin is expensive and you should be sure of what costs you will incur resulting from the injection.  The side effects of botulism toxin use for chronic migraine may include:   -Transient, and usually mild, facial  weakness with facial injections  -Transient, and usually mild, head or neck weakness with head/neck injections  -Reduction or loss of forehead facial animation due to forehead muscle weakness  -Eyelid drooping  -Dry eye  -Pain at the site of injection or bruising at the site of injection  -Double vision  -Potential unknown long term risks   Contraindications: You should not have Botox if you are pregnant, nursing, allergic to albumin, have an infection, skin condition, or muscle weakness at the site of the injection, or have myasthenia gravis, Lambert-Eaton syndrome, or ALS.  It is also possible that as with any injection, there may be an allergic reaction or no effect from the medication. Reduced effectiveness after repeated injections is sometimes seen and rarely infection at the injection site may occur. All care will be taken to prevent these side effects. If therapy is given over a long time, atrophy and wasting in the muscle injected may occur. Occasionally the patient's become refractory to treatment because they develop antibodies to the toxin. In this event, therapy needs to be modified.  I have read the above information and consent to the administration of botulism toxin.    BOTOX PROCEDURE NOTE FOR MIGRAINE HEADACHE  Contraindications and precautions discussed with patient(above). Aseptic procedure was observed and patient tolerated procedure. Procedure performed by Shawnie Dapper, FNP-C.   The condition has existed for more than 6 months, and pt does not have a diagnosis of ALS, Myasthenia Gravis or Lambert-Eaton Syndrome.  Risks and benefits of injections discussed and pt agrees to proceed  with the procedure.  Written consent obtained  These injections are medically necessary. Pt  receives good benefits from these injections. These injections do not cause sedations or hallucinations which the oral therapies may cause.   Description of procedure:  The patient was placed in a  sitting position. The standard protocol was used for Botox as follows, with 5 units of Botox injected at each site:  -Procerus muscle, midline injection  -Corrugator muscle, bilateral injection  -Frontalis muscle, bilateral injection, with 2 sites each side, medial injection was performed in the upper one third of the frontalis muscle, in the region vertical from the medial inferior edge of the superior orbital rim. The lateral injection was again in the upper one third of the forehead vertically above the lateral limbus of the cornea, 1.5 cm lateral to the medial injection site.  -Temporalis muscle injection, 4 sites, bilaterally. The first injection was 3 cm above the tragus of the ear, second injection site was 1.5 cm to 3 cm up from the first injection site in line with the tragus of the ear. The third injection site was 1.5-3 cm forward between the first 2 injection sites. The fourth injection site was 1.5 cm posterior to the second injection site. 5th site laterally in the temporalis  muscleat the level of the outer canthus.  -Occipitalis muscle injection, 3 sites, bilaterally. The first injection was done one half way between the occipital protuberance and the tip of the mastoid process behind the ear. The second injection site was done lateral and superior to the first, 1 fingerbreadth from the first injection. The third injection site was 1 fingerbreadth superiorly and medially from the first injection site.  -Cervical paraspinal muscle injection, 2 sites, bilaterally. The first injection site was 1 cm from the midline of the cervical spine, 3 cm inferior to the lower border of the occipital protuberance. The second injection site was 1.5 cm superiorly and laterally to the first injection site.  -Trapezius muscle injection was performed at 3 sites, bilaterally. The first injection site was in the upper trapezius muscle halfway between the inflection point of the neck, and the acromion. The  second injection site was one half way between the acromion and the first injection site. The third injection was done between the first injection site and the inflection point of the neck.   Will return for repeat injection in 3 months.   A total of 200 units of Botox was prepared, 155 units of Botox was injected as documented above, any Botox not injected was wasted. The patient tolerated the procedure well, there were no complications of the above procedure.
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
## 2023-07-19 ENCOUNTER — Encounter (HOSPITAL_COMMUNITY): Payer: Self-pay | Admitting: Psychiatry

## 2023-07-19 ENCOUNTER — Telehealth (HOSPITAL_COMMUNITY): Payer: Medicare Other | Admitting: Psychiatry

## 2023-07-19 DIAGNOSIS — F338 Other recurrent depressive disorders: Secondary | ICD-10-CM

## 2023-07-19 DIAGNOSIS — F332 Major depressive disorder, recurrent severe without psychotic features: Secondary | ICD-10-CM

## 2023-07-19 DIAGNOSIS — F411 Generalized anxiety disorder: Secondary | ICD-10-CM | POA: Diagnosis not present

## 2023-07-19 MED ORDER — HYDROXYZINE HCL 25 MG PO TABS: 25.0000 mg | ORAL_TABLET | Freq: Every day | ORAL | 1 refills | Status: DC

## 2023-07-19 MED ORDER — TRAZODONE HCL 50 MG PO TABS
50.0000 mg | ORAL_TABLET | Freq: Every evening | ORAL | 2 refills | Status: DC | PRN
Start: 2023-07-19 — End: 2023-09-20

## 2023-07-19 MED ORDER — ARIPIPRAZOLE 2 MG PO TABS: 2.0000 mg | ORAL_TABLET | Freq: Every day | ORAL | 0 refills | Status: DC

## 2023-07-22 ENCOUNTER — Ambulatory Visit: Payer: Medicare Other | Admitting: Family Medicine

## 2023-07-22 ENCOUNTER — Encounter: Payer: Self-pay | Admitting: Family Medicine

## 2023-07-22 DIAGNOSIS — G43719 Chronic migraine without aura, intractable, without status migrainosus: Secondary | ICD-10-CM

## 2023-07-22 MED ORDER — ONABOTULINUMTOXINA 100 UNITS IJ SOLR
155.0000 [IU] | Freq: Once | INTRAMUSCULAR | Status: AC
Start: 2023-07-22 — End: 2023-07-22
  Administered 2023-07-22: 155 [IU] via INTRAMUSCULAR

## 2023-07-22 NOTE — Progress Notes (Signed)
 Botox- 100 units x 2 vials Lot: [id number] Expiration: 27/05 NDC: 5409-8119-14  Bacteriostatic 0.9% Sodium Chloride- 2 mL  Lot: [id number] Expiration: 03/28/24 NDC: 2130865784  Dx: O96.295   B/B Witnessed by Marcelina Morel RN

## 2023-08-14 ENCOUNTER — Ambulatory Visit (HOSPITAL_COMMUNITY): Payer: Medicare Other | Admitting: Clinical

## 2023-08-15 ENCOUNTER — Ambulatory Visit (HOSPITAL_COMMUNITY): Payer: Medicare Other | Admitting: Clinical

## 2023-08-15 ENCOUNTER — Encounter (HOSPITAL_COMMUNITY): Payer: Self-pay | Admitting: Clinical

## 2023-08-15 DIAGNOSIS — F411 Generalized anxiety disorder: Secondary | ICD-10-CM

## 2023-08-15 DIAGNOSIS — F338 Other recurrent depressive disorders: Secondary | ICD-10-CM | POA: Diagnosis not present

## 2023-08-15 NOTE — Progress Notes (Signed)
 THERAPIST PROGRESS NOTE  Session Time: 11:00am-11:32am  Session #14  Virtual Visit via Video Note  I connected with Minha Fulco Ly on 08/15/23 at 11:00 AM EDT by a video enabled telemedicine application and verified that I am speaking with the correct person using two identifiers.  Location: Patient: home Provider: [facility] outpatient therapy office   I discussed the limitations of evaluation and management by telemedicine and the availability of in person appointments. The patient expressed understanding and agreed to proceed.   I discussed the assessment and treatment plan with the patient. The patient was provided an opportunity to ask questions and all were answered. The patient agreed with the plan and demonstrated an understanding of the instructions.   The patient was advised to call back or seek an in-person evaluation if the symptoms worsen or if the condition fails to improve as anticipated.  I provided 32 minutes of non-face-to-face time during this encounter.  Lynnell Chad, LCSW    Participation Level: Active  Behavioral Response: Casual Alert Euthymic and Hopeful  Type of Therapy: Individual Therapy  Treatment Goals addressed:  Goal: LTG: Maintain coping skills to work through anxiety as it arises as evidenced by self report that it is manageable. Goal: STG: Follow all established treatment plans with doctor and therapist for wellness recovery and maintenance. Goal: LTG: Reduce frequency, intensity, and duration of depression symptoms so that daily functioning is improved Goal: LTG: Maintain her self-care and keep using appropriate coping skills (such as reframing thoughts, breathing techniques, grounding exercises, focusing on what she can control, etc.) in order to keep depression in remission as evidenced by PHQ-9 scores Goal: STG: Regain trust of family as evidenced by fewer inquiries about her mental health, whether she is taking her medicine, and  checking on whether she is remaining in therapy. Goal: LTG: Continue to work through her grief over loss of her beloved dog and start to consider adopting a dog. Goal: LTG: Initiate more activities with her sisters and continue to leave the house/engage in behavioral activation, all while implementing appropriate boundaries for self and others.  ProgressTowards Goals: Progressing  Interventions: Supportive and Other: treatment termination    Summary: CHELE CORNELL is a 73 y.o. female who presents with anxiety and depression for therapy.  She presented oriented x5 and stated she was feeling "good, busy."  CSW evaluated patient's medication compliance, use of coping tools, and self-care, as applicable.  She provided an update on various aspects of her life that are normally discussed in therapy, including her family, her job(s), a painting class she took, and how her life is now compared to 1 year ago.  She stated she is going out every day doing various activities and her family no longer questions how she is doing, as they have seen for months now that she is stable and they no longer have to hypervigilant about her mental state.  She is active with her sisters and parents, as well as doing things on her own.  She feels that her mood and attitude on life changed drastically as soon as she got on medication and she will remain under the care of her psychiatrist, at this point having no expectation of going off medicine.  He compared herself to a friend she currently has who is negative on every aspect of her life, reporting that she was like that prior to treatment.  She has learned a lot of coping skills, is more open with people around her about problems  that arise, and feels ready to just continue living her life independently and happily.  She is ready to be discharged from therapy and will return if she sees a need.  She was informed that if this occurs, she only has to call the front desk and she would  be given priority to return.  Suicidal/Homicidal: No without intent/plan  Therapist Response:  Patient is progressing AEB engaging in scheduled therapy session.  Throughout the session, CSW gave patient the opportunity to explore thoughts and feelings associated with current life situations and past/present stressors.   CSW challenged patient gently and appropriately to consider different ways of looking at reported issues. CSW encouraged patient's expression of feelings and validated these using empathy, active listening, open body language, and unconditional positive regard.   CSW reviewed with patient all her treatment goals and together we were able to determine that they have all been "completed/met" in full.      Recommendations:  Return to therapy in 4 weeks, continue to engage in self care behaviors, continue with all her coping skills and self-care behaviors to keep her depression and anxiety in remission, continue in care with psychiatric provider  Plan: Return again  in 8 weeks on 3/20, reduce sessions to every other month after next session that is already scheduled  Diagnosis:  Seasonal affective disorder (HCC)  GAD (generalized anxiety disorder)  Collaboration of Care: Psychiatrist AEB - psychiatrist can read therapy notes; therapist can and does read psychiatric notes prior to sessions   Patient/Guardian was advised Release of Information must be obtained prior to any record release in order to collaborate their care with an outside provider. Patient/Guardian was advised if they have not already done so to contact the registration department to sign all necessary forms in order for [place] to release information regarding their care.   Consent: Patient/Guardian gives verbal consent for treatment and assignment of benefits for services provided during this visit. Patient/Guardian expressed understanding and agreed to proceed.        Lynnell Chad, LCSW 08/15/2023
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
## 2023-09-19 ENCOUNTER — Ambulatory Visit (HOSPITAL_COMMUNITY): Payer: Medicare Other | Admitting: Clinical

## 2023-09-20 ENCOUNTER — Encounter (HOSPITAL_COMMUNITY): Payer: Self-pay | Admitting: Psychiatry

## 2023-09-20 ENCOUNTER — Telehealth (HOSPITAL_COMMUNITY): Payer: Medicare Other | Admitting: Psychiatry

## 2023-09-20 DIAGNOSIS — F338 Other recurrent depressive disorders: Secondary | ICD-10-CM | POA: Diagnosis not present

## 2023-09-20 DIAGNOSIS — F411 Generalized anxiety disorder: Secondary | ICD-10-CM | POA: Diagnosis not present

## 2023-09-20 DIAGNOSIS — F332 Major depressive disorder, recurrent severe without psychotic features: Secondary | ICD-10-CM

## 2023-09-20 MED ORDER — ARIPIPRAZOLE 2 MG PO TABS: 2.0000 mg | ORAL_TABLET | Freq: Every day | ORAL | 0 refills | Status: DC

## 2023-09-20 MED ORDER — TRAZODONE HCL 50 MG PO TABS: 50.0000 mg | ORAL_TABLET | Freq: Every evening | ORAL | 2 refills | Status: DC | PRN

## 2023-09-20 MED ORDER — BUPROPION HCL ER (XL) 150 MG PO TB24: 150.0000 mg | ORAL_TABLET | Freq: Every day | ORAL | 0 refills | Status: DC

## 2023-10-11 NOTE — Progress Notes (Deleted)
 10/11/23 ALL: Hayley Bowman returns for Botox .   07/22/2023 ALL: Hayley Bowman returns for Botox . She continues to do well. Approx 4-5 headache days a month. Aleve works well.   04/18/2023 ALL: Hayley Bowman returns for Botox . She continues to do well. She has about 5-6 headache days a month. She uses Aleve for abortive therapy as rx abortive meds have been too costly.   01/16/2023 ALL: Hayley Bowman returns for Botox . Last procedure with Dr Billy Bue 06/2022. Appt scheduled 5/29 and 01/02/23 but cancelled. She reports doing well. She may have about 6 migraines per month. Naproxen will abort migraine. Baseline daily migraines. She works part tme as Buyer, retail.   07/26/2022 JC: 73 year old female with a history of PTSD, cervical stenosis s/p C4-7 ACDF who follows in clinic for chronic migraines. She stopped Topamax due to mood changes on the medication. Insurance wouldn't cover Qulipta  or naratriptan , so she has just been taking Naproxen as needed. She is a month overdue for Botox  today.   Last injection: 03/29/22    Consent Form Botulism Toxin Injection For Chronic Migraine    Reviewed orally with patient, additionally signature is on file:  Botulism toxin has been approved by the Federal drug administration for treatment of chronic migraine. Botulism toxin does not cure chronic migraine and it may not be effective in some patients.  The administration of botulism toxin is accomplished by injecting a small amount of toxin into the muscles of the neck and head. Dosage must be titrated for each individual. Any benefits resulting from botulism toxin tend to wear off after 3 months with a repeat injection required if benefit is to be maintained. Injections are usually done every 3-4 months with maximum effect peak achieved by about 2 or 3 weeks. Botulism toxin is expensive and you should be sure of what costs you will incur resulting from the injection.  The side effects of botulism toxin use for chronic migraine may  include:   -Transient, and usually mild, facial weakness with facial injections  -Transient, and usually mild, head or neck weakness with head/neck injections  -Reduction or loss of forehead facial animation due to forehead muscle weakness  -Eyelid drooping  -Dry eye  -Pain at the site of injection or bruising at the site of injection  -Double vision  -Potential unknown long term risks   Contraindications: You should not have Botox  if you are pregnant, nursing, allergic to albumin, have an infection, skin condition, or muscle weakness at the site of the injection, or have myasthenia gravis, Lambert-Eaton syndrome, or ALS.  It is also possible that as with any injection, there may be an allergic reaction or no effect from the medication. Reduced effectiveness after repeated injections is sometimes seen and rarely infection at the injection site may occur. All care will be taken to prevent these side effects. If therapy is given over a long time, atrophy and wasting in the muscle injected may occur. Occasionally the patient's become refractory to treatment because they develop antibodies to the toxin. In this event, therapy needs to be modified.  I have read the above information and consent to the administration of botulism toxin.    BOTOX  PROCEDURE NOTE FOR MIGRAINE HEADACHE  Contraindications and precautions discussed with patient(above). Aseptic procedure was observed and patient tolerated procedure. Procedure performed by Terrilyn Fick, FNP-C.   The condition has existed for more than 6 months, and pt does not have a diagnosis of ALS, Myasthenia Gravis or Lambert-Eaton Syndrome.  Risks and benefits  of injections discussed and pt agrees to proceed with the procedure.  Written consent obtained  These injections are medically necessary. Pt  receives good benefits from these injections. These injections do not cause sedations or hallucinations which the oral therapies may cause.   Description  of procedure:  The patient was placed in a sitting position. The standard protocol was used for Botox  as follows, with 5 units of Botox  injected at each site:  -Procerus muscle, midline injection  -Corrugator muscle, bilateral injection  -Frontalis muscle, bilateral injection, with 2 sites each side, medial injection was performed in the upper one third of the frontalis muscle, in the region vertical from the medial inferior edge of the superior orbital rim. The lateral injection was again in the upper one third of the forehead vertically above the lateral limbus of the cornea, 1.5 cm lateral to the medial injection site.  -Temporalis muscle injection, 4 sites, bilaterally. The first injection was 3 cm above the tragus of the ear, second injection site was 1.5 cm to 3 cm up from the first injection site in line with the tragus of the ear. The third injection site was 1.5-3 cm forward between the first 2 injection sites. The fourth injection site was 1.5 cm posterior to the second injection site. 5th site laterally in the temporalis  muscleat the level of the outer canthus.  -Occipitalis muscle injection, 3 sites, bilaterally. The first injection was done one half way between the occipital protuberance and the tip of the mastoid process behind the ear. The second injection site was done lateral and superior to the first, 1 fingerbreadth from the first injection. The third injection site was 1 fingerbreadth superiorly and medially from the first injection site.  -Cervical paraspinal muscle injection, 2 sites, bilaterally. The first injection site was 1 cm from the midline of the cervical spine, 3 cm inferior to the lower border of the occipital protuberance. The second injection site was 1.5 cm superiorly and laterally to the first injection site.  -Trapezius muscle injection was performed at 3 sites, bilaterally. The first injection site was in the upper trapezius muscle halfway between the inflection  point of the neck, and the acromion. The second injection site was one half way between the acromion and the first injection site. The third injection was done between the first injection site and the inflection point of the neck.   Will return for repeat injection in 3 months.   A total of 200 units of Botox  was prepared, 155 units of Botox  was injected as documented above, any Botox  not injected was wasted. The patient tolerated the procedure well, there were no complications of the above procedure.

## 2023-10-14 ENCOUNTER — Telehealth: Payer: Self-pay | Admitting: Family Medicine

## 2023-10-14 ENCOUNTER — Ambulatory Visit: Payer: Medicare Other | Admitting: Family Medicine

## 2023-10-14 DIAGNOSIS — G43719 Chronic migraine without aura, intractable, without status migrainosus: Secondary | ICD-10-CM

## 2023-10-14 NOTE — Telephone Encounter (Signed)
 rs appointment
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
## 2023-11-15 ENCOUNTER — Encounter (HOSPITAL_COMMUNITY): Payer: Self-pay

## 2023-11-15 ENCOUNTER — Encounter (HOSPITAL_COMMUNITY): Admitting: Psychiatry

## 2023-11-15 MED ORDER — ARIPIPRAZOLE 2 MG PO TABS: 2.0000 mg | ORAL_TABLET | Freq: Every day | ORAL | 0 refills | Status: DC

## 2023-11-15 MED ORDER — BUPROPION HCL ER (XL) 150 MG PO TB24: 150.0000 mg | ORAL_TABLET | Freq: Every day | ORAL | 0 refills | Status: DC

## 2023-11-15 MED ORDER — TRAZODONE HCL 50 MG PO TABS: 50.0000 mg | ORAL_TABLET | Freq: Every evening | ORAL | 2 refills | Status: DC | PRN

## 2023-11-15 NOTE — Progress Notes (Signed)
 This encounter was created in error - please disregard.  Patient called to cancel appointment around an hour and a half before time of the appointment.  She had been called into work unexpectedly.  Patient was rescheduled till August 5.

## 2023-12-17 ENCOUNTER — Ambulatory Visit: Admitting: Family Medicine

## 2023-12-30 NOTE — Progress Notes (Unsigned)
 BH MD/PA/NP OP Progress Note  12/31/2023 8:46 AM Hayley Bowman  MRN:  [id number]  Visit Diagnosis:    ICD-10-CM   1. GAD (generalized anxiety disorder)  F41.1 propranolol  (INDERAL ) 10 MG tablet    2. Seasonal affective disorder (HCC)  F33.8     3. Major depressive disorder in partial remission, unspecified whether recurrent (HCC)  F32.4 Lipid panel    Hemoglobin A1c    4. Encounter for long-term (current) use of medications  Z79.899      Assessment: Hayley Bowman is a 73 y.o. female with a history of depression, anxiety, seasonal affective disorder, PTSD, and postconcussive syndrome who presents virtually to [facility] at [facility] for initial evaluation on 07/24/2022 after completing the IOP program  At initial evaluation patient reports a history significant for generalized anxiety disorder and MDD which had improved following her completion of the IOP program.  During the peak of the MDD she endorsed symptoms of isolation, amotivation, anhedonia, fatigue, insomnia, and passive suicidality.  Patient also did endorse a significant past trauma history though denied symptoms consistent with PTSD.  Psychosocially patient has good supports in her family/sisters who she sees and talks to regularly.  At this time she meets criteria for MDD in partial remission and GAD.    Hayley Bowman presents for follow-up evaluation. Today, 12/31/23, patient reports that depression is stable while anxiety remains increased.  This is related to ongoing retrigger PTSD as well as other psychosocial stressors but occurred in the interim.  In addition to anxiety while driving she also has racing thoughts when unoccupied particularly at night which has negatively impacted sleep.  Patient did not start propranolol  in the interim due to an issue getting it filled.  This was resolved today and medication was sent.  Patient will get metabolic labs in the interim and follow up in 6 weeks.  Plan: -  Continue Wellbutrin  XL 150 mg daily - Continue Abilify  2 mg QD  - Continue Trazodone  50 mg QHS prn for insomnia - Continue Atarax  25-50 mg QHS prn for insomnia - Start Propranolol  10 mg BID prn for anxiety - CMP, CBC, A1c, Vit D, TSH, lipid profile reviewed  - Lipid panel and A1c ordered 8/5 - Completed therapy with Hayley Bowman, can restart in the future if needed - Crisis resources reviewed - Follow up in 2 months  Chief Complaint:  Chief Complaint  Patient presents with   Follow-up   HPI: Hayley Bowman presents reporting that the last 3 months her parents are moving to [place] from [place] and her father had a stroke in the interim. There has been stress associated with both of these and her anxiety has been up.  In addition to her residual anxiety around driving.  There had been an issue with the propranolol  and patient was not able to start it in the interim. Medication was resent today.  She reports that her depression has been stable without any concern.  She continues to take the remainder of her medications with good effect.  Hayley Bowman notes that the anxiety has also impacted her sleep which is her primary concern today.  Her thoughts are racing at night and after falling asleep she typically is only asleep for around 4 hours before waking up.  When she wakes up she finds it impossible to get back to sleep.  Did review her blood work and PCP did not check her A1c or lipid panel.  These were both ordered today  and patient will get them in a [facility] in the interim.  Past Psychiatric History: Patient denies any prior psychiatric hospitalizations, suicide attempts, or self-injurious behaviors.  She was enrolled in the IOP program at [facility] in February 2024.  Has been on Wellbutrin , Trazodone , Cymbalta, nortriptyline, and Abilify   She denies any substance use  Past Medical History:  Past Medical History:  Diagnosis Date   Anxiety    History of traumatic head injury 2016    Kidney stones    PTSD (post-traumatic stress disorder)     Past Surgical History:  Procedure Laterality Date   AUGMENTATION MAMMAPLASTY     2015   LAMINECTOMY     REDUCTION MAMMAPLASTY     TONSILLECTOMY      Family History:  Family History  Problem Relation Age of Onset   Breast cancer Maternal Aunt    Breast cancer Paternal Grandmother     Social History:  Social History   Socioeconomic History   Marital status: Divorced    Spouse name: Not on file   Number of children: 0   Years of education: Not on file   Highest education level: Associate degree: occupational, Scientist, product/process development, or vocational program  Occupational History   Not on file  Tobacco Use   Smoking status: Never   Smokeless tobacco: Never  Vaping Use   Vaping status: Never Used  Substance and Sexual Activity   Alcohol use: Not Currently    Comment: rare   Drug use: Not Currently   Sexual activity: Not Currently  Other Topics Concern   Not on file  Social History Narrative   Not on file   Social Drivers of Health   Financial Resource Strain: Low Risk  (10/08/2023)   Received from [facility]   Overall Financial Resource Strain (CARDIA)    Difficulty of Paying Living Expenses: Not very hard  Food Insecurity: No Food Insecurity (10/08/2023)   [facility] from [facility]   Hunger Vital Sign    Within the past 12 months, you worried that your food would run out before you got the money to buy more.: Never true    Within the past 12 months, the food you bought just didn't last and you didn't have money to get more.: Never true  Transportation Needs: No Transportation Needs (10/08/2023)   Received from [facility] - Transportation    Lack of Transportation (Medical): No    Lack of Transportation (Non-Medical): No  Physical Activity: Insufficiently Active (10/08/2023)   Received from [facility]   Exercise Vital Sign    On average, how many days per week do you engage in moderate to strenuous  exercise (like a brisk walk)?: 2 days    On average, how many minutes do you engage in exercise at this level?: 30 min  Stress: No Stress Concern Present (10/08/2023)   Received from [facility] - Occupational Stress Questionnaire    Feeling of Stress : Not at all  Social Connections: Socially Integrated (10/08/2023)   Received from [facility]   Social Network    How would you rate your social network (family, work, friends)?: Good participation with social networks    Allergies:  Allergies  Allergen Reactions   Amoxicillin Nausea And Vomiting    Other reaction(s): UNKNOWN    Amoxicillin-Pot Clavulanate Nausea And Vomiting and Other (See Comments)   Codeine Anxiety, Nausea And Vomiting and Other (See Comments)    Agitation  Other reaction(s): Other (See Comments) Agitation Agitation Gets mean, per patient.    Levetiracetam Nausea And Vomiting    Other reaction(s): Other Wants to kill self.    Nortriptyline Hcl Other (See Comments)    Severe headache   Sulfa Antibiotics Nausea And Vomiting   Statins Other (See Comments)    Joint pain   Valproic Acid    Elemental Sulfur Nausea Only   Lasix [Furosemide] Rash    Current Medications: Current Outpatient Medications  Medication Sig Dispense Refill   propranolol  (INDERAL ) 10 MG tablet Take 1 tablet (10 mg total) by mouth 3 (three) times daily. 90 tablet 2   ARIPiprazole  (ABILIFY ) 2 MG tablet Take 1 tablet (2 mg total) by mouth daily. 90 tablet 0   BOTOX  200 units injection INJECT 200 UNITS INTO THE MUSCLES EVERY 3 MONTHS FOR MIGRAINE MANAGEMENT (MUST RECONSTITUTE) 1 each 1   buPROPion  (WELLBUTRIN  XL) 150 MG 24 hr tablet Take 1 tablet (150 mg total) by mouth daily. 90 tablet 0   Daily Multiple Vitamins tablet Take 1 tablet by mouth daily.     denosumab (PROLIA) 60 MG/ML SOSY injection Inject 60 mg into the skin every 6 (six) months.     hydrOXYzine  (ATARAX ) 25 MG tablet Take 1-2  tablets (25-50 mg total) by mouth at bedtime. 180 tablet 1   pantoprazole (PROTONIX) 20 MG tablet Take 20 mg by mouth daily as needed for heartburn or indigestion.     traZODone  (DESYREL ) 50 MG tablet Take 1 tablet (50 mg total) by mouth at bedtime as needed for sleep. 30 tablet 2   No current facility-administered medications for this visit.     Psychiatric Specialty Exam: Review of Systems  There were no vitals taken for this visit.There is no height or weight on file to calculate BMI.  General Appearance: Well Groomed  Eye Contact:  Good  Speech:  Clear and Coherent and Normal Rate  Volume:  Normal  Mood:  Anxious and Euthymic  Affect:  Appropriate, Congruent, and Tearful  Thought Process:  Coherent and Goal Directed  Orientation:  Full (Time, Place, and Person)  Thought Content: Logical   Suicidal Thoughts:  No  Homicidal Thoughts:  No  Memory:  Immediate;   Good  Judgement:  Good  Insight:  Fair  Psychomotor Activity:  Normal  Concentration:  Concentration: Good  Recall:  Good  Fund of Knowledge: Fair  Language: Good  Akathisia:  NA    AIMS (if indicated): not done  Assets:  Communication Skills Desire for Improvement Financial Resources/Insurance Housing Physical Health Transportation  ADL's:  Intact  Cognition: WNL  Sleep:  Fair   Metabolic Disorder Labs: No results found for: HGBA1C, MPG No results found for: PROLACTIN Lab Results  Component Value Date   CHOL 233 (H) 07/30/2022   TRIG 84 07/30/2022   HDL 87 07/30/2022   CHOLHDL 2.7 07/30/2022   LDLCALC 132 (H) 07/30/2022   No results found for: TSH  Therapeutic Level Labs: No results found for: LITHIUM No results found for: VALPROATE No results found for: CBMZ   Screenings: GAD-7    Flowsheet Row Counselor from 06/14/2023 in [facility] at [facility] from 03/01/2023 in [facility] at [facility]  Total GAD-7 Score 0  0   PHQ2-9    Flowsheet Row Counselor from 06/14/2023 in [facility] at [facility] from 03/01/2023 in [facility] at [facility] from 06/20/2022 in  BEHAVIORAL HEALTH INTENSIVE PSYCH  PHQ-2 Total Score 0 0 5  PHQ-9 Total Score -- 0 21   Flowsheet Row Counselor from 06/20/2022 in BEHAVIORAL HEALTH INTENSIVE [facility] ED from 06/15/2022 in [facility] ED from 01/08/2022 in [facility] Emergency Department at [facility]  C-SSRS RISK CATEGORY Error: Question 6 not populated No Risk No Risk    Collaboration of Care: Collaboration of Care: Medication Management AEB medication prescription, Other provider involved in patient's care AEB pain management chart review, and Referral or follow-up with counselor/therapist AEB chart review  Patient/Guardian was advised Release of Information must be obtained prior to any record release in order to collaborate their care with an outside provider. Patient/Guardian was advised if they have not already done so to contact the registration department to sign all necessary forms in order for us  to release information regarding their care.   Consent: Patient/Guardian gives verbal consent for treatment and assignment of benefits for services provided during this visit. Patient/Guardian expressed understanding and agreed to proceed.    Arvella CHRISTELLA Finder, MD 12/31/2023, 8:46 AM   Virtual Visit via Video Note  I connected with Hayley Bowman on 12/31/23 at  8:30 AM EDT by a video enabled telemedicine application and verified that I am speaking with the correct person using two identifiers.  Location: Patient: Home Provider: Home Office   I discussed the limitations of evaluation and management by telemedicine and the availability of in person appointments. The patient expressed understanding and agreed to proceed.   I discussed the assessment and treatment plan with  the patient. The patient was provided an opportunity to ask questions and all were answered. The patient agreed with the plan and demonstrated an understanding of the instructions.   The patient was advised to call back or seek an in-person evaluation if the symptoms worsen or if the condition fails to improve as anticipated.  I provided 25 minutes of non-face-to-face time during this encounter.   Arvella CHRISTELLA Finder, MD

## 2023-12-31 ENCOUNTER — Telehealth (HOSPITAL_BASED_OUTPATIENT_CLINIC_OR_DEPARTMENT_OTHER): Admitting: Psychiatry

## 2023-12-31 ENCOUNTER — Encounter (HOSPITAL_COMMUNITY): Payer: Self-pay | Admitting: Psychiatry

## 2023-12-31 DIAGNOSIS — F324 Major depressive disorder, single episode, in partial remission: Secondary | ICD-10-CM | POA: Diagnosis not present

## 2023-12-31 DIAGNOSIS — F411 Generalized anxiety disorder: Secondary | ICD-10-CM | POA: Diagnosis not present

## 2023-12-31 DIAGNOSIS — Z79899 Other long term (current) drug therapy: Secondary | ICD-10-CM | POA: Diagnosis not present

## 2023-12-31 DIAGNOSIS — F338 Other recurrent depressive disorders: Secondary | ICD-10-CM

## 2023-12-31 MED ORDER — PROPRANOLOL HCL 10 MG PO TABS
10.0000 mg | ORAL_TABLET | Freq: Three times a day (TID) | ORAL | 2 refills | Status: DC
Start: 2023-12-31 — End: 2024-02-13
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
## 2024-02-11 ENCOUNTER — Other Ambulatory Visit (HOSPITAL_COMMUNITY): Payer: Self-pay | Admitting: Psychiatry

## 2024-02-11 ENCOUNTER — Telehealth (HOSPITAL_COMMUNITY): Payer: Self-pay | Admitting: *Deleted

## 2024-02-11 DIAGNOSIS — F332 Major depressive disorder, recurrent severe without psychotic features: Secondary | ICD-10-CM

## 2024-02-11 NOTE — Telephone Encounter (Signed)
 PA for Hydroxyzine  25 mg tablets has been Approved by [employer] from 02/11/24 through 02/10/25.

## 2024-02-11 NOTE — Telephone Encounter (Signed)
 PA for Hydroxyzine  25 mg submitted to [employer] via Cover My Meds portal. Awaiting determination.

## 2024-02-13 ENCOUNTER — Encounter (HOSPITAL_COMMUNITY): Payer: Self-pay | Admitting: Psychiatry

## 2024-02-13 ENCOUNTER — Other Ambulatory Visit (HOSPITAL_COMMUNITY): Payer: Self-pay | Admitting: Psychiatry

## 2024-02-13 ENCOUNTER — Telehealth (HOSPITAL_COMMUNITY): Admitting: Psychiatry

## 2024-02-13 ENCOUNTER — Other Ambulatory Visit (HOSPITAL_COMMUNITY): Payer: Self-pay | Admitting: *Deleted

## 2024-02-13 DIAGNOSIS — F338 Other recurrent depressive disorders: Secondary | ICD-10-CM

## 2024-02-13 DIAGNOSIS — F411 Generalized anxiety disorder: Secondary | ICD-10-CM | POA: Diagnosis not present

## 2024-02-13 DIAGNOSIS — Z79899 Other long term (current) drug therapy: Secondary | ICD-10-CM

## 2024-02-13 DIAGNOSIS — F332 Major depressive disorder, recurrent severe without psychotic features: Secondary | ICD-10-CM | POA: Diagnosis not present

## 2024-02-13 MED ORDER — FLUOXETINE HCL 10 MG PO CAPS: 10.0000 mg | ORAL_CAPSULE | Freq: Every day | ORAL | 2 refills | Status: DC

## 2024-02-13 MED ORDER — PROPRANOLOL HCL 10 MG PO TABS: 10.0000 mg | ORAL_TABLET | Freq: Three times a day (TID) | ORAL | 2 refills | Status: DC

## 2024-02-13 MED ORDER — HYDROXYZINE HCL 25 MG PO TABS: 25.0000 mg | ORAL_TABLET | Freq: Every day | ORAL | 1 refills | Status: DC

## 2024-02-13 MED ORDER — BUPROPION HCL ER (XL) 150 MG PO TB24: 150.0000 mg | ORAL_TABLET | Freq: Every day | ORAL | 0 refills | Status: DC

## 2024-02-13 MED ORDER — TRAZODONE HCL 50 MG PO TABS: 50.0000 mg | ORAL_TABLET | Freq: Every evening | ORAL | 2 refills | Status: DC | PRN

## 2024-02-13 MED ORDER — ARIPIPRAZOLE 2 MG PO TABS: 2.0000 mg | ORAL_TABLET | Freq: Every day | ORAL | 0 refills | Status: DC

## 2024-02-14 ENCOUNTER — Encounter (HOSPITAL_COMMUNITY): Payer: Self-pay | Admitting: Clinical

## 2024-02-14 ENCOUNTER — Ambulatory Visit (HOSPITAL_COMMUNITY): Admitting: Clinical

## 2024-02-14 DIAGNOSIS — F411 Generalized anxiety disorder: Secondary | ICD-10-CM

## 2024-02-14 DIAGNOSIS — F330 Major depressive disorder, recurrent, mild: Secondary | ICD-10-CM

## 2024-02-14 LAB — HEMOGLOBIN A1C
Est. average glucose Bld gHb Est-mCnc: 105 mg/dL
Hgb A1c MFr Bld: 5.3 % (ref 4.8–5.6)

## 2024-02-14 LAB — LIPID PANEL+APOB
Apolipoprotein B: 103 mg/dL — ABNORMAL HIGH (ref <90)
Cholesterol, Total: 210 mg/dL — ABNORMAL HIGH (ref 100–199)
HDL-C: 60 mg/dL (ref >39)
LDL-C (NIH Calc): 128 mg/dL — ABNORMAL HIGH (ref 0–99)
Non-HDL Cholesterol: 150 mg/dL — ABNORMAL HIGH (ref 0–129)
Triglycerides: 124 mg/dL (ref 0–149)
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
## 2024-02-17 ENCOUNTER — Ambulatory Visit (HOSPITAL_COMMUNITY): Payer: Self-pay | Admitting: Psychiatry

## 2024-03-27 ENCOUNTER — Ambulatory Visit (INDEPENDENT_AMBULATORY_CARE_PROVIDER_SITE_OTHER): Admitting: Clinical

## 2024-03-27 ENCOUNTER — Ambulatory Visit (HOSPITAL_COMMUNITY): Admitting: Clinical

## 2024-03-27 DIAGNOSIS — F411 Generalized anxiety disorder: Secondary | ICD-10-CM | POA: Diagnosis not present

## 2024-03-27 DIAGNOSIS — F330 Major depressive disorder, recurrent, mild: Secondary | ICD-10-CM

## 2024-03-27 NOTE — Progress Notes (Signed)
 THERAPIST PROGRESS NOTE  Session Time: 11:00am-11:37am  Session #16  Virtual Visit via Video Note  I connected with Hayley Bowman on 03/28/24 at 11:00 AM EDT by a video enabled telemedicine application and verified that I am speaking with the correct person using two identifiers.  Location: Patient: home Provider: home office   I discussed the limitations of evaluation and management by telemedicine and the availability of in person appointments. The patient expressed understanding and agreed to proceed.   I discussed the assessment and treatment plan with the patient. The patient was provided an opportunity to ask questions and all were answered. The patient agreed with the plan and demonstrated an understanding of the instructions.   The patient was advised to call back or seek an in-person evaluation if the symptoms worsen or if the condition fails to improve as anticipated.  I provided 37 minutes of non-face-to-face time during this encounter.  [name], LCSW    Participation Level: Active  Behavioral Response: Casual Alert Anxious   Type of Therapy: Individual Therapy  Treatment Goals addressed:  STG: score less than 9 on the PHQ-9 and less than 5 on the GAD-7 as evidenced by intermittent administration of the questionnaires to determine progress in managing depression and anxiety.   STG:  Refresh coping skills for depression and anxiety including boundary setting, breathing, grounding, sleep hygiene, reframing, positive affirmations, gratitude, and psychoeducation on diagnoses including brain injury AEB being able to teach back.  LTG: Explore personal core beliefs, rules and assumptions, and cognitive distortions through therapist using Cognitive Behavioral Therapy; learn about replacement thoughts, Behavioral Activation and Acting As If.    LTG: Learn and practice communication techniques such as active listening, I statements, open-ended questions,  reflective listening, assertiveness, fair fighting rules, initiating conversations, and more as necessary and taught in session.    LTG: Work on forgiveness, shame, sleep, relationship to food, or other issues as appropriate and as these present during sessions. LTG: Work to arts development officer from models like CBT, Stages of Change, DBT, shame resilience theory, ACT, SFBT, MI, trauma-informed therapy and others to be able to manage mental health symptoms, AEB practicing out of session and reporting back.   STG: Process life events to the extent needed so that will be able to move forward with various areas of life in a better frame of mind per self-report of improved satisfaction with life 5 out of 7 days over the next 6 months.  ProgressTowards Goals: Progressing  Interventions: Assertiveness Training and Supportive   Summary: Hayley Bowman is a 73 y.o. female who presents with anxiety and depression for therapy.  She presented oriented x5 and stated she was feeling anxious.  CSW evaluated patient's medication compliance, use of coping tools, and self-care, as applicable.  She provided an update on various aspects of her life that are normally discussed in therapy, including that they keep calling her to take assignments out of town even though she has told them she will only take local assignments now.  She just does not feel up to traveling like that any longer, but they keep asking her and she keeps saying yes.  We discussed boundaries and the fact that she has done the first 3 elements of deciding on the boundaries, telling the other person, and implementing the boundaries.  Now she has to do the 4th element of enforcing the boundary when it is violated.  We talked at length about how she can do that without  being mean, plus the fact that as long as she allows them to violate her boundaries, they are very likely to keep doing so.  We talked about her triggers and although she thought she had listed  a couple, since CSW wrote them down, she could be informed that she listed 7.  This was an eye-opener.  She determined to call this afternoon to refuse any future out-of-town assignments.  She did talk to her mother about whether she and her sisters had bullied them into getting a house in [place] and her mother assured her it was their idea and they believe it was the best thing to do.  She has paid off some credit card debt and wants to not accrue that again, saying that when she is anxious a lot of times she purchases things.  She is trying not to get anxious by not watching the news much any more.  Several old boyfriends have been in town and visited her, thinking they would just start their relationship where they had left off, but she was very firm with her boundaries with them, was congratulated for this.  Finally, her sister wants her to go to [facility] and she is very skeptical.  CSW provided psychoeducation about it and about 12-step programs in general. CSW looked it up and told her how each of the 12 steps is similar to AA, but each is associated with a Bible scripture, which is in her faith system.  She said she would give it a try.  Suicidal/Homicidal: No without intent/plan  Therapist Response:  Patient is progressing AEB engaging in scheduled therapy session.  Throughout the session, CSW gave patient the opportunity to explore thoughts and feelings associated with current life situations and past/present stressors.   CSW challenged patient gently and appropriately to consider different ways of looking at reported issues. CSW encouraged patient's expression of feelings and validated these using empathy, active listening, open body language, and unconditional positive regard.         Plan/Recommendations:  Return to therapy in 2 weeks to next scheduled appointment on 11/14, reflect on what was discussed in session, engage in self care behaviors as explored in session, do  homework as assigned (go to [facility], enforce her boundaries), and return to next session prepared to talk about experience with new coping methods.   Diagnosis:  GAD (generalized anxiety disorder)  Mild episode of recurrent major depressive disorder  Collaboration of Care: Psychiatrist AEB -psychiatrist can read therapy notes; therapist can and does read psychiatric notes prior to sessions   Patient/Guardian was advised Release of Information must be obtained prior to any record release in order to collaborate their care with an outside provider. Patient/Guardian was advised if they have not already done so to contact the registration department to sign all necessary forms in order for us  to release information regarding their care.   Consent: Patient/Guardian gives verbal consent for treatment and assignment of benefits for services provided during this visit. Patient/Guardian expressed understanding and agreed to proceed.      02/14/2024    8:27 AM 06/14/2023    9:15 AM 03/01/2023    8:23 AM 06/20/2022   10:09 AM  Depression screen PHQ 2/9  Decreased Interest 2 0 0 3  Down, Depressed, Hopeless 2 0 0 2  PHQ - 2 Score 4 0 0 5  Altered sleeping 3  0 3  Tired, decreased energy 0  0 3  Change in  appetite 0  0 3  Feeling bad or failure about yourself  1  0 3  Trouble concentrating 0  0 3  Moving slowly or fidgety/restless 1  0 1  Suicidal thoughts 0  0 0  PHQ-9 Score 9  0 21  Difficult doing work/chores    Extremely dIfficult      02/14/2024    8:30 AM 06/14/2023    9:24 AM 03/01/2023    8:28 AM  GAD 7 : Generalized Anxiety Score  Nervous, Anxious, on Edge 3 0 0  Control/stop worrying 2 0 0  Worry too much - different things 2 0 0  Trouble relaxing 3 0 0  Restless 3 0 0  Easily annoyed or irritable 2 0 0  Afraid - awful might happen 1 0 0  Total GAD 7 Score 16 0 0  Anxiety Difficulty Somewhat difficult Not difficult at all      [name],  LCSW 03/28/2024

## 2024-03-28 ENCOUNTER — Encounter (HOSPITAL_COMMUNITY): Payer: Self-pay | Admitting: Clinical

## 2024-04-10 ENCOUNTER — Encounter (HOSPITAL_COMMUNITY): Payer: Self-pay | Admitting: Clinical

## 2024-04-10 ENCOUNTER — Ambulatory Visit (HOSPITAL_COMMUNITY): Admitting: Clinical

## 2024-04-10 DIAGNOSIS — F411 Generalized anxiety disorder: Secondary | ICD-10-CM | POA: Diagnosis not present

## 2024-04-10 DIAGNOSIS — F330 Major depressive disorder, recurrent, mild: Secondary | ICD-10-CM

## 2024-04-10 NOTE — Progress Notes (Signed)
 THERAPIST PROGRESS NOTE  Session Time: 9:02am-9:22am  Session #17  Virtual Visit via Video Note  I connected with Hayley Bowman on 04/10/24 at  9:00 AM EST by a video enabled telemedicine application and verified that I am speaking with the correct person using two identifiers.  Location: Patient: home Provider: home office   I discussed the limitations of evaluation and management by telemedicine and the availability of in person appointments. The patient expressed understanding and agreed to proceed.   I discussed the assessment and treatment plan with the patient. The patient was provided an opportunity to ask questions and all were answered. The patient agreed with the plan and demonstrated an understanding of the instructions.   The patient was advised to call back or seek an in-person evaluation if the symptoms worsen or if the condition fails to improve as anticipated.  I provided 20 minutes of non-face-to-face time during this encounter.  [name], LCSW    Participation Level: Active  Behavioral Response: Casual Alert Euthymic   Type of Therapy: Individual Therapy  Treatment Goals addressed:  STG: score less than 9 on the PHQ-9 and less than 5 on the GAD-7 as evidenced by intermittent administration of the questionnaires to determine progress in managing depression and anxiety.   STG:  Refresh coping skills for depression and anxiety including boundary setting, breathing, grounding, sleep hygiene, reframing, positive affirmations, gratitude, and psychoeducation on diagnoses including brain injury AEB being able to teach back.  LTG: Explore personal core beliefs, rules and assumptions, and cognitive distortions through therapist using Cognitive Behavioral Therapy; learn about replacement thoughts, Behavioral Activation and Acting As If.    LTG: Learn and practice communication techniques such as active listening, I statements, open-ended questions, reflective  listening, assertiveness, fair fighting rules, initiating conversations, and more as necessary and taught in session.    LTG: Work on forgiveness, shame, sleep, relationship to food, or other issues as appropriate and as these present during sessions. LTG: Work to arts development officer from models like CBT, Stages of Change, DBT, shame resilience theory, ACT, SFBT, MI, trauma-informed therapy and others to be able to manage mental health symptoms, AEB practicing out of session and reporting back.   STG: Process life events to the extent needed so that will be able to move forward with various areas of life in a better frame of mind per self-report of improved satisfaction with life 5 out of 7 days over the next 6 months.  ProgressTowards Goals: Progressing  Interventions: Psychosocial Skills: review of what she has been doing and Supportive   Summary: Hayley Bowman is a 73 y.o. female who presents with anxiety and depression for therapy.  She presented oriented x5 and stated she was feeling fine, at the beach with my sister.  CSW evaluated patient's medication compliance, use of coping tools, and self-care, as applicable.  She provided an update on various aspects of her life that are normally discussed in therapy, including what happened when she enforced boundaries with her manager, as discussed at last session.  She did this immediately, she reported, and it was well received.  In fact, those boundaries have now been observed as she requested and she is no longer being asked to travel out of town.  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  She reported that she is significantly less stressed now,  that she has the opportunity to take her parents to their doctors' appointments without worrying about whether work will interfere.  Her parents have settled into their new home,  although she still hears a lot of complaints from father about the traffic in the area and she still is concerned about mother hoarding which creates fall hazards.  We discussed that these are behaviors they simply brought with them from their home in [place], and there is nothing she can do to change them.  She did not start in the [facility] group because of being laid up last week with pain in her shoulder and back and she could not start this week due to being at the beach.  Therefore, she is going to go to the class at another time in the future, remains entirely open to doing so.  She feels she is doing so well that we do not have to meet so often, so we cancelled her next appointment on 12/5 and kept the one for 12/30.  Suicidal/Homicidal: No without intent/plan  Therapist Response:  Patient is progressing AEB engaging in scheduled therapy session.  Throughout the session, CSW gave patient the opportunity to explore thoughts and feelings associated with current life situations and past/present stressors.   CSW challenged patient gently and appropriately to consider different ways of looking at reported issues. CSW encouraged patient's expression of feelings and validated these using empathy, active listening, open body language, and unconditional positive regard.   We cancelled her next appointment at her request because she feels she is doing so well.      Plan/Recommendations:  Return to therapy in 6 weeks to next scheduled appointment on 12/30, reflect on what was discussed in session, engage in self care behaviors as explored in session, do homework as assigned (keep maintaining boundaries), and return to next session prepared to talk about experience with new coping methods.   Diagnosis:  GAD (generalized anxiety disorder)  Mild episode of recurrent major depressive disorder  Collaboration of Care: Psychiatrist AEB -psychiatrist can read therapy notes; therapist can and does  read psychiatric notes prior to sessions   Patient/Guardian was advised Release of Information must be obtained prior to any record release in order to collaborate their care with an outside provider. Patient/Guardian was advised if they have not already done so to contact the registration department to sign all necessary forms in order for us  to release information regarding their care.   Consent: Patient/Guardian gives verbal consent for treatment and assignment of benefits for services provided during this visit. Patient/Guardian expressed understanding and agreed to proceed.       [name], LCSW 04/10/2024

## 2024-04-13 NOTE — Progress Notes (Deleted)
 BH MD/PA/NP OP Progress Note  04/13/2024 10:53 AM Hayley Bowman  MRN:  [id number]  Visit Diagnosis:  No diagnosis found.   Assessment: Hayley Bowman is a 73 y.o. female with a history of depression, anxiety, seasonal affective disorder, PTSD, and postconcussive syndrome who presents virtually to [facility] at [facility] for initial evaluation on 07/24/2022 after completing the IOP program  At initial evaluation patient reports a history significant for generalized anxiety disorder and MDD which had improved following her completion of the IOP program.  During the peak of the MDD she endorsed symptoms of isolation, amotivation, anhedonia, fatigue, insomnia, and passive suicidality.  Patient also did endorse a significant past trauma history though denied symptoms consistent with PTSD.  Psychosocially patient has good supports in her family/sisters who she sees and talks to regularly.  At this time she meets criteria for MDD in partial remission and GAD.    Hayley Bowman presents for follow-up evaluation. Today, 04/13/24, patient    reports that an increase in anxiety and depression.  She has experience excessive worry about numerous things that typically would not of bothered her in the past.  Furthermore the previous symptoms of depression have been gradually growing.  Patient has endorsed disturbed sleep, low mood, excessive tearfulness, and increased isolation.  She is continue to take her medication consistently without adverse side effects.  Notably she did not start the propranolol  due to a pharmacy issue.  Will plan to start propranolol  today and also discussed starting Prozac  10 mg for anxiety/depression.  Risk and benefits of the medication were reviewed.  Patient was open to reconnecting with therapy and opening up to her supports about the increased depression.  We will follow-up in 2 months.  Plan: - Continue Wellbutrin  XL 150 mg daily - Continue Abilify  2 mg QD   - Continue Trazodone  50 mg QHS prn for insomnia - Continue Atarax  25-50 mg QHS prn for insomnia - Start Propranolol  10 mg BID prn for anxiety - Start Prozac  10 mg daily - CMP, CBC, A1c, Vit D, TSH, lipid profile reviewed  - Lipid panel and A1c ordered 8/5 - Completed therapy with [facility], can restart in the future if needed - Crisis resources reviewed - Follow up in 2 months  Chief Complaint:  No chief complaint on file.  HPI: Hayley Bowman presents reporting    that she has been good just working to get her her parents settled in their new place in [place]. Some stress around this primarily as their house in [place] has not sold.  Hayley Bowman endorsed concerned that the anxiety might lead to depression.  On exploration of this however patient admits that she has already started to feel some depression and symptoms have been gradually increasing.  She finds herself constantly worrying and thinking about the worst.  For instance with the new move she is worried that her parents might fall in the bathroom since is not a walk-in shower.  She has also been crying much more frequently and easily compared to the past.  Sleep has declined and she is having difficulty getting through the night.  She denies it progressing to the thoughts of suicide or self-harm like she had in the past.  Patient has not reached out to anyone to let them know that her moods have been declining as she did not want them to worry.  We discussed her current symptoms and the concern for progressing depression.  Recommended that patient confide in her  sisters as they were great supports for her in the past and they were more than willing to help her when she was having a more difficult time.  With her parents recent move Hayley Bowman has been more isolated from her sisters since they have all been busy.  We also recommended reconnecting a therapy which she was open to.  Her therapist was contacted to facilitate this.  Medication wise  patient had not started the propranolol  due to not picking up from the pharmacy.  She was encouraged to do so but we also recommended starting on an SSRI medication to manage anxiety and depression given that they are constant throughout the day currently.  Discussed the risk and benefits of Prozac  which patient was open to starting.  Patient plans to get blood work completed in the interim.  Past Psychiatric History: Patient denies any prior psychiatric hospitalizations, suicide attempts, or self-injurious behaviors.  She was enrolled in the IOP program at [facility] in February 2024.  Has been on Wellbutrin , Trazodone , Cymbalta, nortriptyline, and Abilify   She denies any substance use  Past Medical History:  Past Medical History:  Diagnosis Date   Anxiety    History of traumatic head injury 2016   Kidney stones    PTSD (post-traumatic stress disorder)     Past Surgical History:  Procedure Laterality Date   AUGMENTATION MAMMAPLASTY     2015   LAMINECTOMY     REDUCTION MAMMAPLASTY     TONSILLECTOMY      Family History:  Family History  Problem Relation Age of Onset   Breast cancer Maternal Aunt    Breast cancer Paternal Grandmother     Social History:  Social History   Socioeconomic History   Marital status: Divorced    Spouse name: Not on file   Number of children: 0   Years of education: Not on file   Highest education level: Associate degree: occupational, scientist, product/process development, or vocational program  Occupational History   Not on file  Tobacco Use   Smoking status: Never   Smokeless tobacco: Never  Vaping Use   Vaping status: Never Used  Substance and Sexual Activity   Alcohol use: Not Currently    Comment: rare   Drug use: Not Currently   Sexual activity: Not Currently  Other Topics Concern   Not on file  Social History Narrative   Not on file   Social Drivers of Health   Financial Resource Strain: Low Risk  (04/11/2024)   Received from [facility]   Overall  Financial Resource Strain (CARDIA)    How hard is it for you to pay for the very basics like food, housing, medical care, and heating?: Not hard at all  Food Insecurity: No Food Insecurity (04/11/2024)   Received from [facility]   Hunger Vital Sign    Within the past 12 months, you worried that your food would run out before you got the money to buy more.: Never true    Within the past 12 months, the food you bought just didn't last and you didn't have money to get more.: Never true  Transportation Needs: No Transportation Needs (04/11/2024)   Received from [facility] - Transportation    In the past 12 months, has lack of transportation kept you from medical appointments or from getting medications?: No    In the past 12 months, has lack of transportation kept you from meetings, work, or from getting things needed for daily living?: No  Physical Activity: Insufficiently Active (04/11/2024)   Received from [facility]   Exercise Vital Sign    On average, how many days per week do you engage in moderate to strenuous exercise (like a brisk walk)?: 3 days    On average, how many minutes do you engage in exercise at this level?: 30 min  Stress: No Stress Concern Present (04/11/2024)   Received from [facility] - Occupational Stress Questionnaire    Do you feel stress - tense, restless, nervous, or anxious, or unable to sleep at night because your mind is troubled all the time - these days?: Only a little  Social Connections: Socially Integrated (04/11/2024)   Received from [facility]   Social Network    How would you rate your social network (family, work, friends)?: Good participation with social networks    Allergies:  Allergies  Allergen Reactions   Amoxicillin Nausea And Vomiting    Other reaction(s): UNKNOWN    Amoxicillin-Pot Clavulanate Nausea And Vomiting and Other (See Comments)   Codeine Anxiety, Nausea And  Vomiting and Other (See Comments)    Agitation Other reaction(s): Other (See Comments) Agitation Agitation Gets mean, per patient.    Levetiracetam Nausea And Vomiting    Other reaction(s): Other Wants to kill self.    Nortriptyline Hcl Other (See Comments)    Severe headache   Sulfa Antibiotics Nausea And Vomiting   Statins Other (See Comments)    Joint pain   Valproic Acid    Elemental Sulfur Nausea Only   Lasix [Furosemide] Rash    Current Medications: Current Outpatient Medications  Medication Sig Dispense Refill   ARIPiprazole  (ABILIFY ) 2 MG tablet Take 1 tablet (2 mg total) by mouth daily. 90 tablet 0   BOTOX  200 units injection INJECT 200 UNITS INTO THE MUSCLES EVERY 3 MONTHS FOR MIGRAINE MANAGEMENT (MUST RECONSTITUTE) 1 each 1   buPROPion  (WELLBUTRIN  XL) 150 MG 24 hr tablet Take 1 tablet (150 mg total) by mouth daily. 90 tablet 0   Daily Multiple Vitamins tablet Take 1 tablet by mouth daily.     denosumab (PROLIA) 60 MG/ML SOSY injection Inject 60 mg into the skin every 6 (six) months.     FLUoxetine  (PROZAC ) 10 MG capsule Take 1 capsule (10 mg total) by mouth daily. 30 capsule 2   hydrOXYzine  (ATARAX ) 25 MG tablet Take 1-2 tablets (25-50 mg total) by mouth at bedtime. 180 tablet 1   pantoprazole (PROTONIX) 20 MG tablet Take 20 mg by mouth daily as needed for heartburn or indigestion.     propranolol  (INDERAL ) 10 MG tablet Take 1 tablet (10 mg total) by mouth 3 (three) times daily. 90 tablet 2   traZODone  (DESYREL ) 50 MG tablet Take 1 tablet (50 mg total) by mouth at bedtime as needed for sleep. 30 tablet 2   No current facility-administered medications for this visit.     Psychiatric Specialty Exam: Review of Systems  There were no vitals taken for this visit.There is no height or weight on file to calculate BMI.  General Appearance: Well Groomed  Eye Contact:  Good  Speech:  Clear and Coherent and Normal Rate  Volume:  Normal  Mood:  Anxious and Depressed   Affect:  Appropriate, Congruent, and Tearful  Thought Process:  Coherent and Goal Directed  Orientation:  Full (Time, Place, and Person)  Thought Content: Logical   Suicidal Thoughts:  No  Homicidal Thoughts:  No  Memory:  Immediate;  Good  Judgement:  Fair  Insight:  Fair  Psychomotor Activity:  Normal  Concentration:  Concentration: Good  Recall:  Good  Fund of Knowledge: Fair  Language: Good  Akathisia:  NA    AIMS (if indicated): not done  Assets:  Communication Skills Desire for Improvement Financial Resources/Insurance Housing Physical Health Transportation  ADL's:  Intact  Cognition: WNL  Sleep:  Fair   Metabolic Disorder Labs: Lab Results  Component Value Date   HGBA1C 5.3 02/13/2024   No results found for: PROLACTIN Lab Results  Component Value Date   CHOL 233 (H) 07/30/2022   TRIG 84 07/30/2022   HDL 87 07/30/2022   CHOLHDL 2.7 07/30/2022   LDLCALC 132 (H) 07/30/2022   No results found for: TSH  Therapeutic Level Labs: No results found for: LITHIUM No results found for: VALPROATE No results found for: CBMZ   Screenings: GAD-7    Flowsheet Row Counselor from 02/14/2024 in [facility] at [facility] from 06/14/2023 in [facility] at [facility] from 03/01/2023 in [facility] at [facility]  Total GAD-7 Score 16 0 0   PHQ2-9    Flowsheet Row Counselor from 02/14/2024 in [facility] at [facility] from 06/14/2023 in [facility] Out[facility] at [facility] from 03/01/2023 in [facility] at [facility] from 06/20/2022 in BEHAVIORAL HEALTH INTENSIVE PSYCH  PHQ-2 Total Score 4 0 0 5  PHQ-9 Total Score 9 -- 0 21   Flowsheet Row Counselor from 06/20/2022 in BEHAVIORAL HEALTH INTENSIVE [facility] ED from 06/15/2022 in [facility] ED from 01/08/2022 in [facility] Emergency Department at [facility]  C-SSRS RISK CATEGORY Error: Question 6 not populated No Risk No Risk    Collaboration of Care: Collaboration of Care: Medication Management AEB medication prescription, Other provider involved in patient's care AEB PCP chart review, and Referral or follow-up with counselor/therapist AEB chart review  Patient/Guardian was advised Release of Information must be obtained prior to any record release in order to collaborate their care with an outside provider. Patient/Guardian was advised if they have not already done so to contact the registration department to sign all necessary forms in order for us  to release information regarding their care.   Consent: Patient/Guardian gives verbal consent for treatment and assignment of benefits for services provided during this visit. Patient/Guardian expressed understanding and agreed to proceed.    Arvella CHRISTELLA Finder, MD 04/13/2024, 10:53 AM   Virtual Visit via Video Note  I connected with Hayley Bowman on 04/13/24 at  2:00 PM EST by a video enabled telemedicine application and verified that I am speaking with the correct person using two identifiers.  Location: Patient: Home Provider: Home Office   I discussed the limitations of evaluation and management by telemedicine and the availability of in person appointments. The patient expressed understanding and agreed to proceed.   I discussed the assessment and treatment plan with the patient. The patient was provided an opportunity to ask questions and all were answered. The patient agreed with the plan and demonstrated an understanding of the instructions.   The patient was advised to call back or seek an in-person evaluation if the symptoms worsen or if the condition fails to improve as anticipated.  I provided 25 minutes of non-face-to-face time during this encounter.   Arvella CHRISTELLA Finder, MD

## 2024-04-16 ENCOUNTER — Telehealth (HOSPITAL_COMMUNITY): Admitting: Psychiatry

## 2024-05-01 ENCOUNTER — Ambulatory Visit (HOSPITAL_COMMUNITY): Admitting: Clinical

## 2024-05-26 ENCOUNTER — Ambulatory Visit (HOSPITAL_COMMUNITY): Admitting: Clinical

## 2024-06-08 NOTE — Progress Notes (Unsigned)
 BH MD/PA/NP OP Progress Note  06/09/2024 12:34 PM Hayley Bowman  MRN:  [id number]  Visit Diagnosis:    ICD-10-CM   1. GAD (generalized anxiety disorder)  F41.1 ARIPiprazole  (ABILIFY ) 2 MG tablet    buPROPion  (WELLBUTRIN  XL) 150 MG 24 hr tablet    hydrOXYzine  (ATARAX ) 25 MG tablet    2. Seasonal affective disorder  F33.8 ARIPiprazole  (ABILIFY ) 2 MG tablet    buPROPion  (WELLBUTRIN  XL) 150 MG 24 hr tablet    hydrOXYzine  (ATARAX ) 25 MG tablet    3. Severe episode of recurrent major depressive disorder, without psychotic features (HCC)  F33.2 ARIPiprazole  (ABILIFY ) 2 MG tablet    buPROPion  (WELLBUTRIN  XL) 150 MG 24 hr tablet    hydrOXYzine  (ATARAX ) 25 MG tablet    traZODone  (DESYREL ) 50 MG tablet     Assessment: Hayley Bowman is a 74 y.o. female with a history of depression, anxiety, seasonal affective disorder, PTSD, and postconcussive syndrome who presents virtually to [facility] at [facility] for initial evaluation on 07/24/2022 after completing the IOP program  At initial evaluation patient reports a history significant for generalized anxiety disorder and MDD which had improved following her completion of the IOP program.  During the peak of the MDD she endorsed symptoms of isolation, amotivation, anhedonia, fatigue, insomnia, and passive suicidality.  Patient also did endorse a significant past trauma history though denied symptoms consistent with PTSD.  Psychosocially patient has good supports in her family/sisters who she sees and talks to regularly.  At this time she meets criteria for MDD in partial remission and GAD.    Hayley Bowman presents for follow-up evaluation. Today, 06/09/2024, patient has had improvement in anxiety and depressive symptoms.  No longer endorsing racing thoughts or excessive worry.  She has been sleeping better at night since starting to take hydroxyzine  more consistently.  Patient has also increased her behavioral activation resolved some of  the psychosocial stressors.  Notably she did not start the Prozac  propranolol  as she has not received them from the pharmacy.  Given improvement in mood symptoms we will discontinue both medications today.  Will continue on remainder of current regimen and follow-up in 2 months.  Patient was encouraged to reach out if there is ever issues with obtaining medications from the pharmacy in the future.  Plan: - Continue Wellbutrin  XL 150 mg daily - Continue Abilify  2 mg QD  - Continue Trazodone  50 mg QHS prn for insomnia - Continue Atarax  25-50 mg QHS prn for insomnia - Start Propranolol  10 mg BID prn for anxiety - Start Prozac  10 mg daily - CMP, CBC, A1c, Vit D, TSH, lipid profile reviewed - Repeat 01/2025  - Completed therapy with Elgie Crest, can restart in the future if needed - Crisis resources reviewed - Follow up in 2 months  Chief Complaint:  Chief Complaint  Patient presents with   Follow-up   HPI: Hayley Bowman presents reporting that she is not feeling well. She has Covid and has been feeling particularly fatigued. It does feel like she has passed the worst of it now.  Outside of not feeling well the only frustration currently is with her father.  She has had some worries about his cognition as he keeps losing things daily.  He will frequently reach out to her help which she is glad to do but currently does not feel up to.  Patient feels guilty about not being able to help but also frustrated that her dad asked her knowing  that she is sick.  Prior to catching COVID patient reports that things been going fairly well.  Her mood has improved since last visit and she no longer endorses excessive worry.  She attributes this to taking the hydroxyzine  more consistently at night which has improved her sleep.  She is also reduce some of her psychosocial stress by trying to stay active, limiting time watching the news, and paying off her credit card debt.  Of note patient did not start the Prozac  or  the propranolol  as she did not receive it from the pharmacy.  Recommended that she reach out in the future if she is ever unable to get prescribed medications from the pharmacy.  That said given improvement we will discontinue both Prozac  and propranolol  today.  Past Psychiatric History: Patient denies any prior psychiatric hospitalizations, suicide attempts, or self-injurious behaviors.  She was enrolled in the IOP program at [facility] in February 2024.  Has been on Wellbutrin , Trazodone , Cymbalta, nortriptyline, and Abilify   She denies any substance use  Past Medical History:  Past Medical History:  Diagnosis Date   Anxiety    History of traumatic head injury 2016   Kidney stones    PTSD (post-traumatic stress disorder)     Past Surgical History:  Procedure Laterality Date   AUGMENTATION MAMMAPLASTY     2015   LAMINECTOMY     REDUCTION MAMMAPLASTY     TONSILLECTOMY      Family History:  Family History  Problem Relation Age of Onset   Breast cancer Maternal Aunt    Breast cancer Paternal Grandmother     Social History:  Social History   Socioeconomic History   Marital status: Divorced    Spouse name: Not on file   Number of children: 0   Years of education: Not on file   Highest education level: Associate degree: occupational, scientist, product/process development, or vocational program  Occupational History   Not on file  Tobacco Use   Smoking status: Never   Smokeless tobacco: Never  Vaping Use   Vaping status: Never Used  Substance and Sexual Activity   Alcohol use: Not Currently    Comment: rare   Drug use: Not Currently   Sexual activity: Not Currently  Other Topics Concern   Not on file  Social History Narrative   Not on file   Social Drivers of Health   Tobacco Use: Low Risk (06/09/2024)   Patient History    Smoking Tobacco Use: Never    Smokeless Tobacco Use: Never    Passive Exposure: Not on file  Financial Resource Strain: Low Risk (04/11/2024)   Received from [facility]    Overall Financial Resource Strain (CARDIA)    How hard is it for you to pay for the very basics like food, housing, medical care, and heating?: Not hard at all  Food Insecurity: No Food Insecurity (04/11/2024)   Received from [facility]   Epic    Within the past 12 months, you worried that your food would run out before you got the money to buy more.: Never true    Within the past 12 months, the food you bought just didn't last and you didn't have money to get more.: Never true  Transportation Needs: No Transportation Needs (04/11/2024)   Received from [facility]    In the past 12 months, has lack of transportation kept you from medical appointments or from getting medications?: No    In the past 12 months,  has lack of transportation kept you from meetings, work, or from getting things needed for daily living?: No  Physical Activity: Insufficiently Active (04/11/2024)   Received from [facility]   Exercise Vital Sign    On average, how many days per week do you engage in moderate to strenuous exercise (like a brisk walk)?: 3 days    On average, how many minutes do you engage in exercise at this level?: 30 min  Stress: No Stress Concern Present (04/11/2024)   Received from [facility] - Occupational Stress Questionnaire    Do you feel stress - tense, restless, nervous, or anxious, or unable to sleep at night because your mind is troubled all the time - these days?: Only a little  Social Connections: Socially Integrated (04/11/2024)   Received from [facility]   Social Network    How would you rate your social network (family, work, friends)?: Good participation with social networks  Depression (PHQ2-9): Medium Risk (02/14/2024)   Depression (PHQ2-9)    PHQ-2 Score: 9  Alcohol Screen: Not on file  Housing: Low Risk (04/11/2024)   Received from [facility]    In the last 12 months, was there a time when you were not  able to pay the mortgage or rent on time?: No    In the past 12 months, how many times have you moved where you were living?: 0    At any time in the past 12 months, were you homeless or living in a shelter (including now)?: No  Utilities: Not At Risk (04/11/2024)   Received from [facility]    In the past 12 months has the electric, gas, oil, or water company threatened to shut off services in your home?: No  Health Literacy: Not on file    Allergies:  Allergies  Allergen Reactions   Amoxicillin Nausea And Vomiting    Other reaction(s): UNKNOWN    Amoxicillin-Pot Clavulanate Nausea And Vomiting and Other (See Comments)   Codeine Anxiety, Nausea And Vomiting and Other (See Comments)    Agitation Other reaction(s): Other (See Comments) Agitation Agitation Gets mean, per patient.    Levetiracetam Nausea And Vomiting    Other reaction(s): Other Wants to kill self.    Nortriptyline Hcl Other (See Comments)    Severe headache   Sulfa Antibiotics Nausea And Vomiting   Statins Other (See Comments)    Joint pain   Valproic Acid    Elemental Sulfur Nausea Only   Lasix [Furosemide] Rash    Current Medications: Current Outpatient Medications  Medication Sig Dispense Refill   ARIPiprazole  (ABILIFY ) 2 MG tablet Take 1 tablet (2 mg total) by mouth daily. 90 tablet 0   BOTOX  200 units injection INJECT 200 UNITS INTO THE MUSCLES EVERY 3 MONTHS FOR MIGRAINE MANAGEMENT (MUST RECONSTITUTE) 1 each 1   buPROPion  (WELLBUTRIN  XL) 150 MG 24 hr tablet Take 1 tablet (150 mg total) by mouth daily. 90 tablet 0   Daily Multiple Vitamins tablet Take 1 tablet by mouth daily.     denosumab (PROLIA) 60 MG/ML SOSY injection Inject 60 mg into the skin every 6 (six) months.     hydrOXYzine  (ATARAX ) 25 MG tablet Take 1-2 tablets (25-50 mg total) by mouth at bedtime. 180 tablet 1   pantoprazole (PROTONIX) 20 MG tablet Take 20 mg by mouth daily as needed for heartburn or indigestion.      traZODone  (DESYREL ) 50 MG  tablet Take 1 tablet (50 mg total) by mouth at bedtime as needed for sleep. 30 tablet 2   No current facility-administered medications for this visit.     Psychiatric Specialty Exam: Review of Systems  There were no vitals taken for this visit.There is no height or weight on file to calculate BMI.  General Appearance: Well Groomed  Eye Contact:  Good  Speech:  Clear and Coherent and Normal Rate  Volume:  Normal  Mood:  Euthymic  Affect:  Appropriate and Congruent  Thought Process:  Coherent and Goal Directed  Orientation:  Full (Time, Place, and Person)  Thought Content: Logical   Suicidal Thoughts:  No  Homicidal Thoughts:  No  Memory:  Immediate;   Good  Judgement:  Fair  Insight:  Fair  Psychomotor Activity:  Normal  Concentration:  Concentration: Good  Recall:  Good  Fund of Knowledge: Fair  Language: Good  Akathisia:  NA    AIMS (if indicated): not done  Assets:  Communication Skills Desire for Improvement Financial Resources/Insurance Housing Physical Health Transportation  ADL's:  Intact  Cognition: WNL  Sleep:  Good   Metabolic Disorder Labs: Lab Results  Component Value Date   HGBA1C 5.3 02/13/2024   No results found for: PROLACTIN Lab Results  Component Value Date   CHOL 233 (H) 07/30/2022   TRIG 84 07/30/2022   HDL 87 07/30/2022   CHOLHDL 2.7 07/30/2022   LDLCALC 132 (H) 07/30/2022   No results found for: TSH  Therapeutic Level Labs: No results found for: LITHIUM No results found for: VALPROATE No results found for: CBMZ   Screenings: GAD-7    Flowsheet Row Counselor from 02/14/2024 in [facility] at [facility] from 06/14/2023 in [facility] at [facility] from 03/01/2023 in [facility] at [facility]  Total GAD-7 Score 16 0 0   PHQ2-9    Flowsheet Row Counselor from 02/14/2024 in [facility] at [facility] from 06/14/2023 in [facility] at [facility] from 03/01/2023 in [facility] at [facility] from 06/20/2022 in BEHAVIORAL HEALTH INTENSIVE PSYCH  PHQ-2 Total Score 4 0 0 5  PHQ-9 Total Score 9 -- 0 21   Flowsheet Row Counselor from 06/20/2022 in BEHAVIORAL HEALTH INTENSIVE [facility] ED from 06/15/2022 in [facility] ED from 01/08/2022 in [facility] Emergency Department at [facility]  C-SSRS RISK CATEGORY Error: Question 6 not populated No Risk No Risk    Collaboration of Care: Collaboration of Care: Medication Management AEB medication prescription, Other provider involved in patient's care AEB PCP chart review, and Referral or follow-up with counselor/therapist AEB chart review  Patient/Guardian was advised Release of Information must be obtained prior to any record release in order to collaborate their care with an outside provider. Patient/Guardian was advised if they have not already done so to contact the registration department to sign all necessary forms in order for us  to release information regarding their care.   Consent: Patient/Guardian gives verbal consent for treatment and assignment of benefits for services provided during this visit. Patient/Guardian expressed understanding and agreed to proceed.    Arvella CHRISTELLA Finder, MD 06/09/2024, 12:34 PM   Virtual Visit via Video Note  I connected with Hayley Lang on 06/09/2024 at 10:30 AM EST by a video enabled telemedicine application and verified that I am speaking with the correct person using two identifiers.  Location:  Patient: Home Provider: Home Office   I discussed the limitations of evaluation and management by telemedicine and the availability of in person appointments. The patient expressed understanding and agreed to proceed.   I discussed the assessment and treatment plan  with the patient. The patient was provided an opportunity to ask questions and all were answered. The patient agreed with the plan and demonstrated an understanding of the instructions.   The patient was advised to call back or seek an in-person evaluation if the symptoms worsen or if the condition fails to improve as anticipated.  I provided 20 minutes of non-face-to-face time during this encounter.   Arvella CHRISTELLA Finder, MD

## 2024-06-09 ENCOUNTER — Telehealth (HOSPITAL_COMMUNITY): Admitting: Psychiatry

## 2024-06-09 ENCOUNTER — Encounter (HOSPITAL_COMMUNITY): Payer: Self-pay | Admitting: Psychiatry

## 2024-06-09 DIAGNOSIS — F411 Generalized anxiety disorder: Secondary | ICD-10-CM

## 2024-06-09 DIAGNOSIS — F338 Other recurrent depressive disorders: Secondary | ICD-10-CM

## 2024-06-09 DIAGNOSIS — F332 Major depressive disorder, recurrent severe without psychotic features: Secondary | ICD-10-CM

## 2024-06-09 MED ORDER — BUPROPION HCL ER (XL) 150 MG PO TB24: 150.0000 mg | ORAL_TABLET | Freq: Every day | ORAL | 0 refills | Status: AC

## 2024-06-09 MED ORDER — HYDROXYZINE HCL 25 MG PO TABS: 25.0000 mg | ORAL_TABLET | Freq: Every day | ORAL | 1 refills | Status: AC

## 2024-06-09 MED ORDER — TRAZODONE HCL 50 MG PO TABS: 50.0000 mg | ORAL_TABLET | Freq: Every evening | ORAL | 2 refills | Status: AC | PRN

## 2024-06-09 MED ORDER — ARIPIPRAZOLE 2 MG PO TABS: 2.0000 mg | ORAL_TABLET | Freq: Every day | ORAL | 0 refills | Status: AC

## 2024-06-16 ENCOUNTER — Ambulatory Visit (HOSPITAL_COMMUNITY): Admitting: Clinical

## 2024-06-16 ENCOUNTER — Encounter (HOSPITAL_COMMUNITY): Payer: Self-pay | Admitting: Clinical

## 2024-06-16 DIAGNOSIS — F330 Major depressive disorder, recurrent, mild: Secondary | ICD-10-CM | POA: Diagnosis not present

## 2024-06-16 DIAGNOSIS — F411 Generalized anxiety disorder: Secondary | ICD-10-CM | POA: Diagnosis not present

## 2024-06-16 NOTE — Progress Notes (Signed)
 THERAPIST PROGRESS NOTE  Session Time: 10:01am-10:18am  Session #18  Virtual Visit via Video Note  I connected with Hayley Bowman on 06/16/24 at 10:00 AM EST by a video enabled telemedicine application and verified that I am speaking with the correct person using two identifiers.  Location: Patient: home Provider: home office   I discussed the limitations of evaluation and management by telemedicine and the availability of in person appointments. The patient expressed understanding and agreed to proceed.   I discussed the assessment and treatment plan with the patient. The patient was provided an opportunity to ask questions and all were answered. The patient agreed with the plan and demonstrated an understanding of the instructions.   The patient was advised to call back or seek an in-person evaluation if the symptoms worsen or if the condition fails to improve as anticipated.  I provided 17 minutes of non-face-to-face time during this encounter.  [name], LCSW    Participation Level: Active  Behavioral Response: Casual Alert Euthymic   Type of Therapy: Individual Therapy  Treatment Goals addressed:  STG: score less than 9 on the PHQ-9 and less than 5 on the GAD-7 as evidenced by intermittent administration of the questionnaires to determine progress in managing depression and anxiety.   STG:  Refresh coping skills for depression and anxiety including boundary setting, breathing, grounding, sleep hygiene, reframing, positive affirmations, gratitude, and psychoeducation on diagnoses including brain injury AEB being able to teach back.  LTG: Explore personal core beliefs, rules and assumptions, and cognitive distortions through therapist using Cognitive Behavioral Therapy; learn about replacement thoughts, Behavioral Activation and Acting As If.    LTG: Learn and practice communication techniques such as active listening, I statements, open-ended questions,  reflective listening, assertiveness, fair fighting rules, initiating conversations, and more as necessary and taught in session.    LTG: Work on forgiveness, shame, sleep, relationship to food, or other issues as appropriate and as these present during sessions. LTG: Work to arts development officer from models like CBT, Stages of Change, DBT, shame resilience theory, ACT, SFBT, MI, trauma-informed therapy and others to be able to manage mental health symptoms, AEB practicing out of session and reporting back.   STG: Process life events to the extent needed so that will be able to move forward with various areas of life in a better frame of mind per self-report of improved satisfaction with life 5 out of 7 days over the next 6 months.  ProgressTowards Goals: Progressing  Interventions: Assertiveness Training, Supportive, and Other: Closure   Summary: Hayley Bowman is a 74 y.o. female who presents with anxiety and depression for therapy.  Patient presented for planned termination of therapy, stating that things continue to go very well and that she does not feel a need to continue therapy at this time. She reported having COVID recently and experiencing persistent fatigue; however, she stated that emotionally she feels stable and that no new issues have arisen since the last session. Patient shared that she has seen her psychiatrist, who is also extending the time between appointments due to her stability.  Patient reported that the holidays went very well and that her family remains supportive. She discussed concerns regarding her father, who has had several recent falls, is experiencing memory loss, and has become somewhat argumentative. Patient stated that at 74 years old she views these changes as natural and is coping appropriately.  Patient reported continued success in maintaining a boundary with her boss that she  will not travel long distances for work, despite ongoing requests. She stated that this  boundary has not only felt empowering but has also freed up her mind, as she no longer experiences stress related to planning or anticipating these trips. Patient noted she has not yet attended a [facility] meeting, may consider doing so in the future, but does not feel any immediate mental health needs at this time.  Suicidal/Homicidal: No without intent/plan  Therapist Response:  Patient presented as calm, reflective, and emotionally stable. She demonstrated good insight, effective boundary-setting, and sustained progress toward treatment goals. No acute distress noted. Mood euthymic; affect congruent. No SI/HI expressed. Patient appears appropriate for discharge at this time.      Plan/Recommendations:  Therapy services concluded per patient request. CSW provided closure, reviewed progress and growth achieved in therapy, and reinforced the patients strengths and coping skills. Patient was encouraged to continue utilizing learned strategies and to reach out in the future if she wishes to reengage in services..   Diagnosis:  GAD (generalized anxiety disorder)  Mild episode of recurrent major depressive disorder  Collaboration of Care: Psychiatrist AEB -psychiatrist can read therapy notes; therapist can and does read psychiatric notes prior to sessions   Patient/Guardian was advised Release of Information must be obtained prior to any record release in order to collaborate their care with an outside provider. Patient/Guardian was advised if they have not already done so to contact the registration department to sign all necessary forms in order for us  to release information regarding their care.   Consent: Patient/Guardian gives verbal consent for treatment and assignment of benefits for services provided during this visit. Patient/Guardian expressed understanding and agreed to proceed.   [name], LCSW 06/16/2024     06/16/2024   10:28 AM 02/14/2024    8:27 AM  06/14/2023    9:15 AM 03/01/2023    8:23 AM 06/20/2022   10:09 AM  Depression screen PHQ 2/9  Decreased Interest 0 2 0 0 3  Down, Depressed, Hopeless 0 2 0 0 2  PHQ - 2 Score 0 4 0 0 5  Altered sleeping  3  0 3  Tired, decreased energy  0  0 3  Change in appetite  0  0 3  Feeling bad or failure about yourself   1  0 3  Trouble concentrating  0  0 3  Moving slowly or fidgety/restless  1  0 1  Suicidal thoughts  0  0 0  PHQ-9 Score  9   0  21   Difficult doing work/chores     Extremely dIfficult     Data saved with a previous flowsheet row definition      06/16/2024   10:28 AM 02/14/2024    8:30 AM 06/14/2023    9:24 AM 03/01/2023    8:28 AM  GAD 7 : Generalized Anxiety Score  Nervous, Anxious, on Edge 1 3  0  0   Control/stop worrying 0 2  0  0   Worry too much - different things 0 2  0  0   Trouble relaxing 0 3  0  0   Restless 0 3  0  0   Easily annoyed or irritable 0 2  0  0   Afraid - awful might happen 0 1  0  0   Total GAD 7 Score 1 16 0 0  Anxiety Difficulty  Somewhat difficult Not difficult at all  Data saved with a previous flowsheet row definition

## 2024-06-26 ENCOUNTER — Ambulatory Visit (HOSPITAL_COMMUNITY): Admitting: Clinical

## 2024-08-04 ENCOUNTER — Telehealth (HOSPITAL_COMMUNITY): Admitting: Psychiatry
